# Patient Record
Sex: Female | Born: 1990 | Hispanic: Yes | Marital: Married | Smoking: Never smoker
Health system: Southern US, Community
[De-identification: ages and names within clinical notes are randomized; demographics above are authoritative.]

## PROBLEM LIST (undated history)

## (undated) ENCOUNTER — Emergency Department: Admission: EM | Discharge status: Absent without leave | Payer: Self-pay

## (undated) ENCOUNTER — Inpatient Hospital Stay: Payer: Self-pay

## (undated) DIAGNOSIS — O480 Post-term pregnancy: Secondary | ICD-10-CM

## (undated) DIAGNOSIS — N390 Urinary tract infection, site not specified: Secondary | ICD-10-CM

## (undated) DIAGNOSIS — Z8669 Personal history of other diseases of the nervous system and sense organs: Secondary | ICD-10-CM

## (undated) HISTORY — PX: NO PAST SURGERIES: SHX2092

## (undated) HISTORY — PX: OTHER SURGICAL HISTORY: SHX169

## (undated) HISTORY — DX: Urinary tract infection, site not specified: N39.0

## (undated) HISTORY — DX: Personal history of other diseases of the nervous system and sense organs: Z86.69

---

## 2014-03-13 LAB — SICKLE CELL SCREEN: Sickle Cell Screen: NORMAL

## 2014-03-14 LAB — OB RESULTS CONSOLE RPR: RPR: NONREACTIVE

## 2014-03-14 LAB — OB RESULTS CONSOLE VARICELLA ZOSTER ANTIBODY, IGG: Varicella: IMMUNE

## 2014-03-14 LAB — OB RESULTS CONSOLE ANTIBODY SCREEN: Antibody Screen: NEGATIVE

## 2014-03-14 LAB — OB RESULTS CONSOLE HGB/HCT, BLOOD: HEMOGLOBIN: 12.7 g/dL

## 2014-03-14 LAB — OB RESULTS CONSOLE RUBELLA ANTIBODY, IGM: Rubella: IMMUNE

## 2014-03-14 LAB — OB RESULTS CONSOLE HEPATITIS B SURFACE ANTIGEN: HEP B S AG: NEGATIVE

## 2014-03-14 LAB — OB RESULTS CONSOLE ABO/RH: RH TYPE: POSITIVE

## 2014-03-14 LAB — OB RESULTS CONSOLE HIV ANTIBODY (ROUTINE TESTING): HIV: NONREACTIVE

## 2014-03-24 ENCOUNTER — Ambulatory Visit: Payer: Self-pay | Admitting: Family Medicine

## 2014-04-07 NOTE — L&D Delivery Note (Signed)
Delivery Note At 10:08 AM a viable Female was delivered via Vaginal, Spontaneous Delivery (Presentation: Left Occiput Anterior).  APGAR: , ; weight  .   Placenta status:Expressed,[place] with velamentous insertion.  Cord: 3 vessels with the following complications:none .  Cord pH: NA  Anesthesia: None  Episiotomy: None Lacerations: None Suture Repair:NA Est. Blood Loss (mL): 350  Mom to postpartum.  Baby to Couplet care / Skin to Skin.  [name] 10/26/2014, 10:23 AM

## 2014-05-26 ENCOUNTER — Ambulatory Visit: Payer: Self-pay | Admitting: Advanced Practice Midwife

## 2014-09-20 LAB — OB RESULTS CONSOLE GBS: STREP GROUP B AG: NEGATIVE

## 2014-09-27 ENCOUNTER — Other Ambulatory Visit: Payer: Self-pay | Admitting: Advanced Practice Midwife

## 2014-09-27 DIAGNOSIS — IMO0002 Reserved for concepts with insufficient information to code with codable children: Secondary | ICD-10-CM

## 2014-10-02 ENCOUNTER — Ambulatory Visit
Admission: RE | Admit: 2014-10-02 | Discharge: 2014-10-02 | Disposition: A | Discharge status: Home / Self Care | Payer: Self-pay | Source: Ambulatory Visit | Attending: Advanced Practice Midwife | Admitting: Advanced Practice Midwife

## 2014-10-02 DIAGNOSIS — IMO0002 Reserved for concepts with insufficient information to code with codable children: Secondary | ICD-10-CM

## 2014-10-02 DIAGNOSIS — Z36 Encounter for antenatal screening of mother: Secondary | ICD-10-CM | POA: Insufficient documentation

## 2014-10-02 DIAGNOSIS — Z3A37 37 weeks gestation of pregnancy: Secondary | ICD-10-CM | POA: Insufficient documentation

## 2014-10-25 ENCOUNTER — Observation Stay
Admission: RE | Admit: 2014-10-25 | Discharge: 2014-10-25 | Disposition: A | Discharge status: Home / Self Care | Payer: Medicaid Other | Attending: Obstetrics and Gynecology | Admitting: Obstetrics and Gynecology

## 2014-10-25 DIAGNOSIS — O480 Post-term pregnancy: Secondary | ICD-10-CM

## 2014-10-25 HISTORY — DX: Post-term pregnancy: O48.0

## 2014-10-25 NOTE — Plan of Care (Signed)
Discharge instructions given and explained through interpreter.  Verbalized understanding. Signed copy on chart and one in hand.

## 2014-10-26 ENCOUNTER — Encounter: Payer: Self-pay | Admitting: *Deleted

## 2014-10-26 ENCOUNTER — Ambulatory Visit (INDEPENDENT_AMBULATORY_CARE_PROVIDER_SITE_OTHER): Payer: Self-pay | Admitting: Obstetrics and Gynecology

## 2014-10-26 ENCOUNTER — Encounter: Payer: Self-pay | Admitting: Obstetrics and Gynecology

## 2014-10-26 ENCOUNTER — Inpatient Hospital Stay
Admission: EM | Admit: 2014-10-26 | Discharge: 2014-10-27 | DRG: 775 | Disposition: A | Discharge status: Home / Self Care | Payer: Medicaid Other | Attending: Obstetrics and Gynecology | Admitting: Obstetrics and Gynecology

## 2014-10-26 ENCOUNTER — Other Ambulatory Visit: Payer: Self-pay | Admitting: Obstetrics and Gynecology

## 2014-10-26 VITALS — BP 122/76 | HR 96 | Ht 64.0 in | Wt 159.1 lb

## 2014-10-26 DIAGNOSIS — Z3A40 40 weeks gestation of pregnancy: Secondary | ICD-10-CM | POA: Diagnosis present

## 2014-10-26 DIAGNOSIS — Z3493 Encounter for supervision of normal pregnancy, unspecified, third trimester: Secondary | ICD-10-CM

## 2014-10-26 DIAGNOSIS — Z3490 Encounter for supervision of normal pregnancy, unspecified, unspecified trimester: Secondary | ICD-10-CM

## 2014-10-26 DIAGNOSIS — O480 Post-term pregnancy: Secondary | ICD-10-CM | POA: Diagnosis present

## 2014-10-26 DIAGNOSIS — O479 False labor, unspecified: Secondary | ICD-10-CM

## 2014-10-26 HISTORY — DX: Post-term pregnancy: O48.0

## 2014-10-26 LAB — CBC
HCT: 36.8 % (ref 35.0–47.0)
Hemoglobin: 12.1 g/dL (ref 12.0–16.0)
MCH: 25.9 pg — AB (ref 26.0–34.0)
MCHC: 32.9 g/dL (ref 32.0–36.0)
MCV: 78.7 fL — ABNORMAL LOW (ref 80.0–100.0)
Platelets: 230 10*3/uL (ref 150–440)
RBC: 4.67 MIL/uL (ref 3.80–5.20)
RDW: 15.5 % — AB (ref 11.5–14.5)
WBC: 23.1 10*3/uL — AB (ref 3.6–11.0)

## 2014-10-26 LAB — TYPE AND SCREEN
ABO/RH(D): O POS
ANTIBODY SCREEN: NEGATIVE

## 2014-10-26 LAB — ABO/RH: ABO/RH(D): O POS

## 2014-10-26 MED ORDER — FERROUS SULFATE 325 (65 FE) MG PO TABS
325.0000 mg | ORAL_TABLET | Freq: Two times a day (BID) | ORAL | Status: DC
  Administered 2014-10-26: 325 mg via ORAL
  Filled 2014-10-26: qty 1

## 2014-10-26 MED ORDER — SODIUM CHLORIDE 0.9 % IV SOLN: 1.0000 g | INTRAVENOUS | Status: DC

## 2014-10-26 MED ORDER — ONDANSETRON HCL 4 MG/2ML IJ SOLN: 4.0000 mg | Freq: Four times a day (QID) | INTRAMUSCULAR | Status: DC | PRN

## 2014-10-26 MED ORDER — IBUPROFEN 800 MG PO TABS
800.0000 mg | ORAL_TABLET | Freq: Three times a day (TID) | ORAL | Status: DC
  Administered 2014-10-26 (×2): 800 mg via ORAL
  Filled 2014-10-26 (×2): qty 1

## 2014-10-26 MED ORDER — OXYTOCIN BOLUS FROM INFUSION: 500.0000 mL | INTRAVENOUS | Status: DC

## 2014-10-26 MED ORDER — PRENATAL MULTIVITAMIN CH
1.0000 | ORAL_TABLET | Freq: Every day | ORAL | Status: DC
  Administered 2014-10-26: 1 via ORAL
  Filled 2014-10-26: qty 1

## 2014-10-26 MED ORDER — DIPHENHYDRAMINE HCL 25 MG PO CAPS: 25.0000 mg | ORAL_CAPSULE | Freq: Four times a day (QID) | ORAL | Status: DC | PRN

## 2014-10-26 MED ORDER — CITRIC ACID-SODIUM CITRATE 334-500 MG/5ML PO SOLN: 30.0000 mL | ORAL | Status: DC | PRN

## 2014-10-26 MED ORDER — OXYCODONE-ACETAMINOPHEN 5-325 MG PO TABS
ORAL_TABLET | ORAL | Status: AC
  Administered 2014-10-26: 1 via ORAL
  Filled 2014-10-26: qty 2

## 2014-10-26 MED ORDER — LACTATED RINGERS IV SOLN: INTRAVENOUS | Status: DC

## 2014-10-26 MED ORDER — OXYCODONE-ACETAMINOPHEN 5-325 MG PO TABS: 1.0000 | ORAL_TABLET | ORAL | Status: DC | PRN

## 2014-10-26 MED ORDER — MEASLES, MUMPS & RUBELLA VAC ~~LOC~~ INJ: 0.5000 mL | INJECTION | Freq: Once | SUBCUTANEOUS | Status: DC

## 2014-10-26 MED ORDER — DOCUSATE SODIUM 100 MG PO CAPS
100.0000 mg | ORAL_CAPSULE | Freq: Two times a day (BID) | ORAL | Status: DC
  Administered 2014-10-26: 100 mg via ORAL
  Filled 2014-10-26: qty 1

## 2014-10-26 MED ORDER — TETANUS-DIPHTH-ACELL PERTUSSIS 5-2.5-18.5 LF-MCG/0.5 IM SUSP: 0.5000 mL | INTRAMUSCULAR | Status: DC | PRN

## 2014-10-26 MED ORDER — LANOLIN HYDROUS EX OINT: TOPICAL_OINTMENT | CUTANEOUS | Status: DC | PRN

## 2014-10-26 MED ORDER — OXYTOCIN 10 UNIT/ML IJ SOLN
INTRAMUSCULAR | Status: AC
  Administered 2014-10-26: 10:00:00
  Filled 2014-10-26: qty 1

## 2014-10-26 MED ORDER — OXYTOCIN 40 UNITS IN LACTATED RINGERS INFUSION - SIMPLE MED: 62.5000 mL/h | INTRAVENOUS | Status: DC

## 2014-10-26 MED ORDER — OXYCODONE-ACETAMINOPHEN 5-325 MG PO TABS
2.0000 | ORAL_TABLET | ORAL | Status: DC | PRN
Start: 2014-10-26 — End: 2014-10-27
  Administered 2014-10-26 (×2): 1 via ORAL
  Filled 2014-10-26: qty 1
  Filled 2014-10-26: qty 2

## 2014-10-26 MED ORDER — ACETAMINOPHEN 325 MG PO TABS: 650.0000 mg | ORAL_TABLET | ORAL | Status: DC | PRN

## 2014-10-26 MED ORDER — BENZOCAINE-MENTHOL 20-0.5 % EX AERO: 1.0000 "application " | INHALATION_SPRAY | CUTANEOUS | Status: DC | PRN

## 2014-10-26 MED ORDER — LIDOCAINE HCL (PF) 1 % IJ SOLN
30.0000 mL | INTRAMUSCULAR | Status: DC | PRN
  Filled 2014-10-26: qty 30

## 2014-10-26 MED ORDER — FENTANYL CITRATE (PF) 100 MCG/2ML IJ SOLN: 50.0000 ug | INTRAMUSCULAR | Status: DC | PRN

## 2014-10-26 MED ORDER — WITCH HAZEL-GLYCERIN EX PADS: 1.0000 "application " | MEDICATED_PAD | CUTANEOUS | Status: DC | PRN

## 2014-10-26 MED ORDER — OXYCODONE-ACETAMINOPHEN 5-325 MG PO TABS: 2.0000 | ORAL_TABLET | ORAL | Status: DC | PRN

## 2014-10-26 MED ORDER — LACTATED RINGERS IV SOLN: 500.0000 mL | INTRAVENOUS | Status: DC | PRN

## 2014-10-26 MED ORDER — DIBUCAINE 1 % RE OINT: 1.0000 "application " | TOPICAL_OINTMENT | RECTAL | Status: DC | PRN

## 2014-10-26 NOTE — H&P (Signed)
Tamara Hahn is a 24 y.o. female G3P2002 at 64.6 weeks presenting for delivery  History OB History    Gravida Para Term Preterm AB TAB SAB Ectopic Multiple Living   0 0     Past Medical History  Diagnosis Date  . Post-dates pregnancy 10/25/2014   No past surgical history on file. Family History: family history is not on file. Social History:  reports that she has never smoked. She does not have any smokeless tobacco history on file. She reports that she does not drink alcohol or use illicit drugs.   Prenatal Transfer Tool  Maternal Diabetes: No Genetic Screening: Normal Maternal Ultrasounds/Referrals: Normal Fetal Ultrasounds or other Referrals:   Maternal Substance Abuse:  No Significant Maternal Medications:  None Significant Maternal Lab Results:  None Other Comments:  PNC at [facility] and [facility]. Anatomy scan reportedly normal per pt. No records for review.  ROS Painful contractions. No PIH symptoms.   unknown if currently breastfeeding. Exam Physical Exam  Prenatal labs: ABO, Rh:   Antibody:   Rubella:   RPR:    HBsAg:    HIV:    GBS:     Assessment/Plan: 40.6 week IUP in active labor   Anticipate SVD  Daphine Deutscher A Treena Cosman 10/26/2014, 10:28 AM

## 2014-10-26 NOTE — Progress Notes (Signed)
Chief complaint: 1.  Delivery management planning. 2.  40-6/[redacted] weeks gestation. 3.  Contractions.  Patient is a 24 year old gravida 3, para 2002, female at 13.[redacted] weeks gestation who presents for delivery management planning.  She has been having contractions since 7 AM, which are extremely painful.  No rupture membranes.  No vaginal bleeding.  OBJECTIVE: There were no vitals taken for this visit. Cervix: 6 cm/90% effaced/0 station/vertex. IMPRESSION: 1.  Term IUP in active labor. PLAN: 1.  To labor and delivery for delivery.

## 2014-10-27 LAB — CBC
HCT: 34.5 % — ABNORMAL LOW (ref 35.0–47.0)
HEMOGLOBIN: 11.2 g/dL — AB (ref 12.0–16.0)
MCH: 25.6 pg — ABNORMAL LOW (ref 26.0–34.0)
MCHC: 32.5 g/dL (ref 32.0–36.0)
MCV: 78.9 fL — ABNORMAL LOW (ref 80.0–100.0)
Platelets: 222 10*3/uL (ref 150–440)
RBC: 4.37 MIL/uL (ref 3.80–5.20)
RDW: 15.6 % — ABNORMAL HIGH (ref 11.5–14.5)
WBC: 17.2 10*3/uL — ABNORMAL HIGH (ref 3.6–11.0)

## 2014-10-27 LAB — RPR: RPR Ser Ql: NONREACTIVE

## 2014-10-27 MED ORDER — OXYCODONE-ACETAMINOPHEN 5-325 MG PO TABS: 2.0000 | ORAL_TABLET | ORAL | Status: DC | PRN

## 2014-10-27 MED ORDER — IBUPROFEN 800 MG PO TABS: 800.0000 mg | ORAL_TABLET | Freq: Three times a day (TID) | ORAL | Status: DC

## 2014-10-27 MED ORDER — PRENATAL MULTIVITAMIN CH: 1.0000 | ORAL_TABLET | Freq: Every day | ORAL | Status: AC

## 2014-10-27 MED ORDER — MEDROXYPROGESTERONE ACETATE 150 MG/ML IM SUSP: 150.0000 mg | Freq: Once | INTRAMUSCULAR | Status: DC

## 2014-10-27 MED ORDER — MEDROXYPROGESTERONE ACETATE 150 MG/ML IM SUSP
150.0000 mg | Freq: Once | INTRAMUSCULAR | Status: AC
  Administered 2014-10-27: 150 mg via INTRAMUSCULAR
  Filled 2014-10-27: qty 1

## 2014-10-27 NOTE — Discharge Instructions (Signed)

## 2014-10-27 NOTE — Progress Notes (Signed)
Post Partum Day 1 Subjective: no complaints, up ad lib, voiding and tolerating PO  Objective: Blood pressure 116/71, pulse 67, temperature 98 F (36.7 C), temperature source Oral, resp. rate 20, weight 154 lb (69.854 kg), SpO2 100 %, unknown if currently breastfeeding.  Physical Exam:  General: alert and cooperative Lochia: appropriate Uterine Fundus: firm Incision: none DVT Evaluation: No evidence of DVT seen on physical exam.   Recent Labs  10/26/14 1228 10/27/14 0535  HGB 12.1 11.2*  HCT 36.8 34.5*    Assessment/Plan: Discharge home, Breastfeeding and Contraception Condoms   LOS: 1 day   Tamara Hahn 10/27/2014, 7:50 AM

## 2014-10-27 NOTE — Progress Notes (Signed)
Pt discharged at this time via wheelchair escorted by nurse tech; pt going home with significant other and new baby

## 2014-10-27 NOTE — Discharge Summary (Signed)
Obstetric Discharge Summary Reason for Admission: onset of labor Prenatal Procedures: NST and ultrasound Intrapartum Procedures: spontaneous vaginal delivery Postpartum Procedures: none Complications-Operative and Postpartum: none HEMOGLOBIN  Date Value Ref Range Status  10/27/2014 11.2* 12.0 - 16.0 g/dL Final  40/98/1191 47.8 g/dL Final   HCT  Date Value Ref Range Status  10/27/2014 34.5* 35.0 - 47.0 % Final    Physical Exam:  General: alert and cooperative Lochia: appropriate Uterine Fundus: firm Incision: none DVT Evaluation: No evidence of DVT seen on physical exam.  Discharge Diagnoses: Term Pregnancy-delivered and Breastfeeding  Discharge Information: Date: 10/27/2014 Activity: unrestricted Diet: routine Medications: PNV, Ibuprofen, Percocet and Depo Provera Condition: stable Instructions: refer to practice specific booklet and Depo Provera given before discharge Discharge to: home Follow-up Information    Follow up with [facility] In 6 weeks.   Why:  6 week Post Partum Check   Contact information:   [address] [place] [address] [place] [address] [phone]       Newborn Data: Live born female  Birth Weight: 7 lb 3.5 oz (3275 g) APGAR: 8, 9  Home with mother.  Daphine Deutscher A Taiten Brawn 10/27/2014, 8:22 AM

## 2018-04-07 NOTE — L&D Delivery Note (Addendum)
Delivery Note  In room to see patient, reports pelvic pressure and the urge to push. SVE: 10/100/+3, vertex at 1232. Effective maternal pushing efforts noted.   Spontaneous vaginal birth of liveborn female infant in left occiput anterior position at 1241 over intact perineum. Infant immediate to maternal abdomen. Delayed cord clamping, skin to skin, three (3) vessel cord and cord blood collected. APGARs: 7, 9. Weight pending. Receiving nurse present at bedside for birth.   IM pitocin given in left thigh, see chart. Spontaneous delivery of intact placenta 1246, routine disposal. Uterus firm. Lochia small. QBL: 435 ml. Anesthesia none. Vault check completed. Counts correct x 2.   Initiate routine postpartum care and orders. Mom to postpartum.  Baby to Couplet care / Skin to Skin.  FOB present at bedside and overjoyed with birth.    Diona Fanti, CNM [facility] 12/25/2018, 1:28 PM

## 2018-04-28 LAB — OB RESULTS CONSOLE HIV ANTIBODY (ROUTINE TESTING): HIV: NONREACTIVE

## 2018-06-07 ENCOUNTER — Other Ambulatory Visit: Payer: Self-pay | Admitting: Advanced Practice Midwife

## 2018-06-07 DIAGNOSIS — Z369 Encounter for antenatal screening, unspecified: Secondary | ICD-10-CM

## 2018-06-07 LAB — OB RESULTS CONSOLE HEPATITIS B SURFACE ANTIGEN: Hepatitis B Surface Ag: NEGATIVE

## 2018-06-07 LAB — OB RESULTS CONSOLE HGB/HCT, BLOOD
HCT: 36 (ref 29–41)
Hemoglobin: 12.5

## 2018-06-07 LAB — OB RESULTS CONSOLE PLATELET COUNT: Platelets: 317000

## 2018-06-07 LAB — OB RESULTS CONSOLE RPR: RPR: NONREACTIVE

## 2018-06-07 LAB — OB RESULTS CONSOLE ABO/RH
ABO/RH(D): O POS
RH Type: POSITIVE

## 2018-06-07 LAB — OB RESULTS CONSOLE GC/CHLAMYDIA
Chlamydia: NEGATIVE
Gonorrhea: NEGATIVE

## 2018-06-07 LAB — OB RESULTS CONSOLE VARICELLA ZOSTER ANTIBODY, IGG: Varicella: IMMUNE

## 2018-06-07 LAB — OB RESULTS CONSOLE ANTIBODY SCREEN: Antibody Screen: NEGATIVE

## 2018-06-07 LAB — OB RESULTS CONSOLE RUBELLA ANTIBODY, IGM: Rubella: IMMUNE

## 2018-06-21 ENCOUNTER — Other Ambulatory Visit: Payer: Self-pay

## 2018-06-21 DIAGNOSIS — O0991 Supervision of high risk pregnancy, unspecified, first trimester: Secondary | ICD-10-CM

## 2018-06-24 ENCOUNTER — Ambulatory Visit (HOSPITAL_BASED_OUTPATIENT_CLINIC_OR_DEPARTMENT_OTHER)
Admission: RE | Admit: 2018-06-24 | Discharge: 2018-06-24 | Disposition: A | Discharge status: Home / Self Care | Payer: Self-pay | Source: Ambulatory Visit | Attending: Maternal & Fetal Medicine | Admitting: Maternal & Fetal Medicine

## 2018-06-24 ENCOUNTER — Ambulatory Visit
Admission: RE | Admit: 2018-06-24 | Discharge: 2018-06-24 | Disposition: A | Discharge status: Home / Self Care | Payer: Self-pay | Source: Ambulatory Visit | Attending: Maternal & Fetal Medicine | Admitting: Maternal & Fetal Medicine

## 2018-06-24 ENCOUNTER — Other Ambulatory Visit: Payer: Self-pay

## 2018-06-24 VITALS — BP 105/60 | HR 88 | Temp 98.4°F | Resp 18 | Wt 147.0 lb

## 2018-06-24 DIAGNOSIS — Z369 Encounter for antenatal screening, unspecified: Secondary | ICD-10-CM

## 2018-06-24 DIAGNOSIS — Z3A12 12 weeks gestation of pregnancy: Secondary | ICD-10-CM | POA: Insufficient documentation

## 2018-06-24 DIAGNOSIS — Z3682 Encounter for antenatal screening for nuchal translucency: Secondary | ICD-10-CM | POA: Insufficient documentation

## 2018-06-24 DIAGNOSIS — Z8279 Family history of other congenital malformations, deformations and chromosomal abnormalities: Secondary | ICD-10-CM

## 2018-06-24 NOTE — Progress Notes (Signed)
Referring physician:  [facility] Department Length of Consultation: 40 minutes   [name]  was referred to [facility] [place] for genetic counseling to review prenatal screening and testing options.  This note summarizes the information we discussed.    We offered the following routine screening tests for this pregnancy:  First trimester screening, which includes nuchal translucency ultrasound screen and first trimester maternal serum marker screening.  The nuchal translucency has approximately an 80% detection rate for Down syndrome and can be positive for other chromosome abnormalities as well as congenital heart defects.  When combined with a maternal serum marker screening, the detection rate is up to 90% for Down syndrome and up to 97% for trisomy 18.     Maternal serum marker screening, a blood test that measures pregnancy proteins, can provide risk assessments for Down syndrome, trisomy 18, and open neural tube defects (spina bifida, anencephaly). Because it does not directly examine the fetus, it cannot positively diagnose or rule out these problems.  Targeted ultrasound uses high frequency sound waves to create an image of the developing fetus.  An ultrasound is often recommended as a routine means of evaluating the pregnancy.  It is also used to screen for fetal anatomy problems (for example, a heart defect) that might be suggestive of a chromosomal or other abnormality.   Should these screening tests indicate an increased concern, then the following additional testing options would be offered:  The chorionic villus sampling procedure is available for first trimester chromosome analysis.  This involves the withdrawal of a small amount of chorionic villi (tissue from the developing placenta).  Risk of pregnancy loss is estimated to be approximately 1 in 200 to 1 in 100 (0.5 to 1%).  There is approximately a 1% (1 in 100) chance that the CVS  chromosome results will be unclear.  Chorionic villi cannot be tested for neural tube defects.     Amniocentesis involves the removal of a small amount of amniotic fluid from the sac surrounding the fetus with the use of a thin needle inserted through the maternal abdomen and uterus.  Ultrasound guidance is used throughout the procedure.  Fetal cells from amniotic fluid are directly evaluated and > 99.5% of chromosome problems and > 98% of open neural tube defects can be detected. This procedure is generally performed after the 15th week of pregnancy.  The main risks to this procedure include complications leading to miscarriage in less than 1 in 200 cases (0.5%).  As another option for information if the pregnancy is suspected to be an an increased chance for certain chromosome conditions, we also reviewed the availability of cell free fetal DNA testing from maternal blood to determine whether or not the baby may have either Down syndrome, trisomy 26, or trisomy 65.  This test utilizes a maternal blood sample and DNA sequencing technology to isolate circulating cell free fetal DNA from maternal plasma.  The fetal DNA can then be analyzed for DNA sequences that are derived from the three most common chromosomes involved in aneuploidy, chromosomes 13, 18, and 21.  If the overall amount of DNA is greater than the expected level for any of these chromosomes, aneuploidy is suspected.  While we do not consider it a replacement for invasive testing and karyotype analysis, a negative result from this testing would be reassuring, though not a guarantee of a normal chromosome complement for the baby.  An abnormal result is certainly suggestive of an abnormal  chromosome complement, though we would still recommend CVS or amniocentesis to confirm any findings from this testing.  Cystic Fibrosis and Spinal Muscular Atrophy (SMA) screening were also discussed with the patient. Both conditions are recessive, which means that  both parents must be carriers in order to have a child with the disease.  Cystic fibrosis (CF) is one of the most common genetic conditions in persons of Caucasian ancestry.  This condition occurs in approximately 1 in 2,500 Caucasian persons and results in thickened secretions in the lungs, digestive, and reproductive systems.  For a baby to be at risk for having CF, both of the parents must be carriers for this condition.  Approximately 1 in 67 Caucasian persons is a carrier for CF.  Current carrier testing looks for the most common mutations in the gene for CF and can detect approximately 90% of carriers in the Caucasian population.  This means that the carrier screening can greatly reduce, but cannot eliminate, the chance for an individual to have a child with CF.  If an individual is found to be a carrier for CF, then carrier testing would be available for the partner. As part of [place]'s newborn screening profile, all babies born in the state of [place] will have a two-tier screening process.  Specimens are first tested to determine the concentration of immunoreactive trypsinogen (IRT).  The top 5% of specimens with the highest IRT values then undergo DNA testing using a panel of over 40 common CF mutations. SMA is a neurodegenerative disorder that leads to atrophy of skeletal muscle and overall weakness.  This condition is also more prevalent in the Caucasian population, with 1 in 40-1 in 60 persons being a carrier and 1 in 6,000-1 in 10,000 children being affected.  There are multiple forms of the disease, with some causing death in infancy to other forms with survival into adulthood.  The genetics of SMA is complex, but carrier screening can detect up to 95% of carriers in the Caucasian population.  Similar to CF, a negative result can greatly reduce, but cannot eliminate, the chance to have a child with SMA.  We obtained a detailed family history and pregnancy history. The patient reported  that she had two paternal half siblings born with abnormalities of the brain.  One passed away at birth and the other passed away at 12 days of life.  She has no details about the type of birth defect or possible causes.  We discussed the use of second trimester ultrasound to assess the brain anatomy as well as third trimester ultrasound to assess for ventriculomegaly. In addition, her partner reported a maternal first cousin with Down syndrome.  Down syndrome is caused by an extra copy of the genetic instructions located on chromosome number 21.  These extra instructions result in the characteristic facial appearance, intellectual disabilities and an increased risk for some types of birth defects.  The majority (95%) of persons with Down syndrome have three freestanding copies of chromosome number 21, called trisomy 72.  This type of Down syndrome occurs as a sporadic condition and does not increase the chance for other family members to have Down syndrome.  Rarely, Down syndrome is caused by a rearrangement of the genetic instructions, where the extra chromosome number 21 is attached to another chromosome.  This type of chromosome rearrangement can be passed down through families and therefore may increase the chance for Down syndrome in other family members.  We cannot determine which type of  Down syndrome is present without documentation of chromosome studies in the affected family member.  If any additional information is obtained about this history, please do not hesitate to contact us. The remainder of the family history was reported to be unremarkable for birth defects, intellectual delays, recurrent pregnancy loss or known chromosome abnormalities.  [name] stated that this is her fourth pregnancy, the second with her current partner. All three of her children are in good health.  She reported no complications or exposures in this pregnancy that would be expected to increase the risk for birth  defects.  After consideration of the options, [name] elected to proceed with an ultrasound and to decline carrier testing as well as first trimester screening.  An ultrasound was performed at the time of the visit.  Fetal anatomy could not be assessed due to early gestational age.  Please refer to the ultrasound report for details of that study.  [name] was encouraged to call with questions or concerns.  We can be contacted at [phone].  Labs ordered: none  Cherly Anderson, MS, CGC

## 2018-07-01 NOTE — Progress Notes (Signed)
Pt seen by me, agree with assessment and plan as outlined by [facility].

## 2018-08-04 LAB — OB RESULTS CONSOLE TSH: TSH: 0.995

## 2018-08-26 ENCOUNTER — Other Ambulatory Visit: Payer: Self-pay

## 2018-08-26 DIAGNOSIS — O0992 Supervision of high risk pregnancy, unspecified, second trimester: Secondary | ICD-10-CM

## 2018-08-26 DIAGNOSIS — O0991 Supervision of high risk pregnancy, unspecified, first trimester: Secondary | ICD-10-CM

## 2018-09-02 ENCOUNTER — Ambulatory Visit
Admit: 2018-09-02 | Discharge: 2018-09-02 | Disposition: A | Discharge status: Home / Self Care | Payer: Self-pay | Source: Ambulatory Visit | Attending: Maternal & Fetal Medicine | Admitting: Maternal & Fetal Medicine

## 2018-09-02 ENCOUNTER — Other Ambulatory Visit: Payer: Self-pay

## 2018-09-02 DIAGNOSIS — Z3A22 22 weeks gestation of pregnancy: Secondary | ICD-10-CM | POA: Insufficient documentation

## 2018-09-02 DIAGNOSIS — O0992 Supervision of high risk pregnancy, unspecified, second trimester: Secondary | ICD-10-CM | POA: Insufficient documentation

## 2018-10-12 DIAGNOSIS — Z3480 Encounter for supervision of other normal pregnancy, unspecified trimester: Secondary | ICD-10-CM

## 2018-10-12 NOTE — Progress Notes (Signed)
Chart abstraction complete 10/12/2018.

## 2018-10-13 ENCOUNTER — Other Ambulatory Visit: Payer: Self-pay

## 2018-10-13 ENCOUNTER — Other Ambulatory Visit: Payer: Self-pay | Admitting: Family Medicine

## 2018-10-13 ENCOUNTER — Ambulatory Visit: Payer: Self-pay | Admitting: Nurse Practitioner

## 2018-10-13 VITALS — BP 104/61 | Temp 97.8°F | Wt 157.2 lb

## 2018-10-13 DIAGNOSIS — Z3483 Encounter for supervision of other normal pregnancy, third trimester: Secondary | ICD-10-CM

## 2018-10-13 DIAGNOSIS — Z23 Encounter for immunization: Secondary | ICD-10-CM

## 2018-10-13 LAB — HEMOGLOBIN, FINGERSTICK: Hemoglobin: 11.9 g/dL (ref 11.1–15.9)

## 2018-10-13 LAB — OB RESULTS CONSOLE HIV ANTIBODY (ROUTINE TESTING): HIV: NONREACTIVE

## 2018-10-13 NOTE — Progress Notes (Signed)
   PRENATAL VISIT NOTE  Subjective:  Tamara Hahn is a 28 y.o. [phone] at [redacted]w[redacted]d being seen today for ongoing prenatal care.  She is currently monitored for the following issues for this low-risk pregnancy and has Post-dates pregnancy; SVD (spontaneous vaginal delivery); Pregnancy; Family history of congenital anomalies; and First trimester screening on their problem list.  Patient reports no complaints.   .  .   . denies vomiting, abdominal pain, fussiness, diarrhea, cough and difficulty breathing leaking of fluid/ROM.   The following portions of the patient's history were reviewed and updated as appropriate: allergies, current medications, past family history, past medical history, past social history, past surgical history and problem list. Problem list updated.  Objective:   Vitals:   10/13/18 1313  BP: 104/61  Temp: 97.8 F (36.6 C)  Weight: 157 lb 3.2 oz (71.3 kg)    Fetal Status:           General:  Alert, oriented and cooperative. Patient is in no acute distress.  Skin: Skin is warm and dry. No rash noted.   Cardiovascular: Normal heart rate noted  Respiratory: Normal respiratory effort, no problems with respiration noted  Abdomen: Soft, gravid, appropriate for gestational age.        Pelvic: Cervical exam deferred        Extremities: Normal range of motion.     Mental Status: Normal mood and affect. Normal behavior. Normal judgment and thought content.   Assessment and Plan:  Pregnancy: G4P3003 at [redacted]w[redacted]d  1. Prenatal care, subsequent pregnancy, third trimester Client here for 28 wk labs Await results Blood transfusion information discussed and consent signed  Denies any additional questions or concerns at this time Client verbalizes understanding and is in agreement with plan of care    Preterm labor symptoms and general obstetric precautions including but not limited to vaginal bleeding, contractions, leaking of fluid and fetal movement were reviewed in  detail with the patient. Please refer to After Visit Summary for other counseling recommendations.  No follow-ups on file.  No future appointments.  Berniece Andreas, NP

## 2018-10-13 NOTE — Progress Notes (Signed)
In for visit; 28 wk. Labs today; discussed [place] Blood Transfusion Info.; Tdap today Debera Lat

## 2018-10-14 LAB — RPR: RPR Ser Ql: NONREACTIVE

## 2018-10-14 LAB — GLUCOSE, 1 HOUR GESTATIONAL: Gestational Diabetes Screen: 84 mg/dL (ref 65–139)

## 2018-10-15 DIAGNOSIS — Z3480 Encounter for supervision of other normal pregnancy, unspecified trimester: Secondary | ICD-10-CM | POA: Insufficient documentation

## 2018-10-17 DIAGNOSIS — O99281 Endocrine, nutritional and metabolic diseases complicating pregnancy, first trimester: Secondary | ICD-10-CM | POA: Insufficient documentation

## 2018-10-17 DIAGNOSIS — E079 Disorder of thyroid, unspecified: Secondary | ICD-10-CM

## 2018-10-17 DIAGNOSIS — Z6740 Type O blood, Rh positive: Secondary | ICD-10-CM | POA: Insufficient documentation

## 2018-10-17 DIAGNOSIS — Z87798 Personal history of other (corrected) congenital malformations: Secondary | ICD-10-CM | POA: Insufficient documentation

## 2018-10-27 ENCOUNTER — Other Ambulatory Visit: Payer: Self-pay

## 2018-10-27 ENCOUNTER — Ambulatory Visit: Payer: Self-pay | Admitting: Nurse Practitioner

## 2018-10-27 DIAGNOSIS — Z3480 Encounter for supervision of other normal pregnancy, unspecified trimester: Secondary | ICD-10-CM

## 2018-10-27 NOTE — Progress Notes (Signed)
   TELEPHONE OBSTETRICS VISIT ENCOUNTER NOTE  I connected with Tamara Hahn on 10/27/18 at 10:00 AM EDT by telephone at home and verified that I am speaking with the correct person using two identifiers.   I discussed the limitations, risks, security and privacy concerns of performing an evaluation and management service by telephone and the availability of in person appointments. I also discussed with the patient that there may be a patient responsible charge related to this service. The patient expressed understanding and agreed to proceed.  Subjective:  Tamara Hahn is a 28 y.o. G5P3003 at [redacted]w[redacted]d being followed for ongoing prenatal care.  She is currently monitored for the following issues for this high-risk pregnancy and has Family history of congenital anomalies; Encounter for supervision of normal pregnancy in multigravida; Thyroid dysfunction in pregnancy, antepartum, first trimester; Type O blood, Rh positive; and Low birth weight infant on their problem list.  Patient reports no complaints. Reports fetal movement. Denies any contractions, bleeding or leaking of fluid.   The following portions of the patient's history were reviewed and updated as appropriate: allergies, current medications, past family history, past medical history, past social history, past surgical history and problem list.   Objective:   General:  Alert, oriented and cooperative.   Mental Status: Normal mood and affect perceived. Normal judgment and thought content.  Rest of physical exam deferred due to type of encounter  Assessment and Plan:  Pregnancy: G5P3003 at [redacted]w[redacted]d There are no diagnoses linked to this encounter. Preterm labor symptoms and general obstetric precautions including but not limited to vaginal bleeding, contractions, leaking of fluid and fetal movement were reviewed in detail with the patient.  I discussed the assessment and treatment plan with the patient. The patient was provided  an opportunity to ask questions and all were answered. The patient agreed with the plan and demonstrated an understanding of the instructions. The patient was advised to call back or seek an in-person office evaluation/go to the hospital for any urgent or concerning symptoms.  Please refer to After Visit Summary for other counseling recommendations.   I provided 4 minutes of non-face-to-face time during this encounter.  Return in about 2 weeks (around 11/10/2018) for routine prenatal care.  Future Appointments  Date Time Provider [place]  11/10/2018  1:40 PM [facility] PROVIDER [facility] None    Tamara Andreas, NP

## 2018-10-27 NOTE — Progress Notes (Signed)
Telehealth phone call appt with patient for RN portion. RN identified patient and confirmed current location. Patient states she does not have scales and home BP cuff to assess weight and BP. Patient denies s/s or exposure to Covid-19. Patient taking PNV QD and denies ED/ hospital visits since last RV. Patient scheduled 2 week RV appt for 11/10/2018 @ 1:40. Instructed patient to remain at home phone number for provider to call and complete portion of appt.  Hal Morales, RN

## 2018-11-10 ENCOUNTER — Other Ambulatory Visit: Payer: Self-pay

## 2018-11-10 ENCOUNTER — Ambulatory Visit: Payer: Self-pay | Admitting: Nurse Practitioner

## 2018-11-10 DIAGNOSIS — Z3480 Encounter for supervision of other normal pregnancy, unspecified trimester: Secondary | ICD-10-CM

## 2018-11-10 NOTE — Progress Notes (Signed)
Negative responses to Covid-19 screening questions for client and 30 year old daughter with her at appt today. Denies international travel for self or FOB since pregnant. Taking PNV QD. Shona Needles, RN

## 2018-11-10 NOTE — Progress Notes (Signed)
PRENATAL VISIT NOTE  Subjective:  Tamara Hahn is a 28 y.o. G5P3003 at [redacted]w[redacted]d being seen today for ongoing prenatal care.  She is currently monitored for the following issues for this low-risk pregnancy and has Family history of congenital anomalies; Encounter for supervision of normal pregnancy in multigravida; Thyroid dysfunction in pregnancy, antepartum, first trimester; Type O blood, Rh positive; and Low birth weight infant on their problem list.  Patient reports no complaints.  Contractions: Not present. Vag. Bleeding: None.  Movement: Present. Denies leaking of fluid/ROM.   The following portions of the patient's history were reviewed and updated as appropriate: allergies, current medications, past family history, past medical history, past social history, past surgical history and problem list. Problem list updated.  Objective:   Vitals:   11/10/18 1335  BP: 98/68  Temp: 98.4 F (36.9 C)  Weight: 160 lb 12.8 oz (72.9 kg)    Fetal Status: Fetal Heart Rate (bpm): 138 Fundal Height: 31 cm Movement: Present     General:  Alert, oriented and cooperative. Patient is in no acute distress.  Skin: Skin is warm and dry. No rash noted.   Cardiovascular: Normal heart rate noted  Respiratory: Normal respiratory effort, no problems with respiration noted  Abdomen: Soft, gravid, appropriate for gestational age.  Pain/Pressure: Absent     Pelvic: Cervical exam deferred        Extremities: Normal range of motion.  Edema: None  Mental Status: Normal mood and affect. Normal behavior. Normal judgment and thought content.   Assessment and Plan:  Pregnancy: [id number] at [redacted]w[redacted]d  1. Encounter for supervision of normal pregnancy in multigravida Client doing well  Client asked the following questions for review: Any headaches - denies Swelling in feet or hands - denies ROM - denies Vaginal bleeding or discharge - denies Feeling the baby move - yes Any contractions - denies Taking PNV  daily - yes [place] separate of PNV - N/A Do you smoke or did you smoke prior to pregnancy - n/a Next visit will be: 2 wks - Telehealth   Preterm labor symptoms and general obstetric precautions including but not limited to vaginal bleeding, contractions, leaking of fluid and fetal movement were reviewed in detail with the patient. Please refer to After Visit Summary for other counseling recommendations.  Return in about 2 weeks (around 11/24/2018) for telehealth.  Future Appointments  Date Time Provider [place]  11/24/2018  1:20 PM [facility] PROVIDER [facility] None    Berniece Andreas, NP

## 2018-11-24 ENCOUNTER — Telehealth: Payer: Self-pay

## 2018-11-24 ENCOUNTER — Ambulatory Visit: Payer: Self-pay | Admitting: Nurse Practitioner

## 2018-11-24 ENCOUNTER — Other Ambulatory Visit: Payer: Self-pay

## 2018-11-24 DIAGNOSIS — Z3480 Encounter for supervision of other normal pregnancy, unspecified trimester: Secondary | ICD-10-CM

## 2018-11-24 NOTE — Telephone Encounter (Signed)
Attempted to call for [facility] Telehealth visit; no answer, left voicemail message via interpreter [place]. [place] Debera Lat, RN

## 2018-11-24 NOTE — Progress Notes (Signed)
Call to client for Telehealth visit; confirmed identity; agrees to visit; reports taking PNV and no complaints Debera Lat, RN

## 2018-11-24 NOTE — Progress Notes (Signed)
     TELEPHONE OBSTETRICS VISIT ENCOUNTER NOTE  I connected with Tamara Hahn on 11/24/18 at  1:20 PM EDT by telephone at home and verified that I am speaking with the correct person using two identifiers.   I discussed the limitations, risks, security and privacy concerns of performing an evaluation and management service by telephone and the availability of in person appointments. I also discussed with the patient that there may be a patient responsible charge related to this service. The patient expressed understanding and agreed to proceed.  Subjective:  Tamara Hahn is a 28 y.o. G5P3003 at [redacted]w[redacted]d being followed for ongoing prenatal care.  She is currently monitored for the following issues for this low-risk pregnancy and has Family history of congenital anomalies; Encounter for supervision of normal pregnancy in multigravida; Thyroid dysfunction in pregnancy, antepartum, first trimester; Type O blood, Rh positive; and Low birth weight infant on their problem list.  Patient reports no complaints. Reports fetal movement. Denies any contractions, bleeding or leaking of fluid.   The following portions of the patient's history were reviewed and updated as appropriate: allergies, current medications, past family history, past medical history, past social history, past surgical history and problem list.   Objective:   General:  Alert, oriented and cooperative.   Mental Status: Normal mood and affect perceived. Normal judgment and thought content.  Rest of physical exam deferred due to type of encounter  Assessment and Plan:  Pregnancy: G5P3003 at [redacted]w[redacted]d 1. Encounter for supervision of normal pregnancy in multigravida Client doing well Reviewed OB flow sheet questions and denies any need for any refills on PNV's at this time. Client denies the following: Headaches, smoking currently or prior to pregnancy and vaginal discharge.  Preterm labor symptoms and general obstetric  precautions including but not limited to vaginal bleeding, contractions, leaking of fluid and fetal movement were reviewed in detail with the patient.  I discussed the assessment and treatment plan with the patient. The patient was provided an opportunity to ask questions and all were answered. The patient agreed with the plan and demonstrated an understanding of the instructions. The patient was advised to call back or seek an in-person office evaluation/go to the hospital for any urgent or concerning symptoms.  Please refer to After Visit Summary for other counseling recommendations.   I provided 5 minutes of non-face-to-face time during this encounter.  No follow-ups on file.  Future Appointments  Date Time Provider [place]  12/08/2018  8:20 AM [facility] PROVIDER [facility] None    Berniece Andreas, NP

## 2018-12-08 ENCOUNTER — Ambulatory Visit: Payer: Self-pay | Admitting: Advanced Practice Midwife

## 2018-12-08 ENCOUNTER — Other Ambulatory Visit: Payer: Self-pay

## 2018-12-08 VITALS — BP 105/70 | Temp 97.6°F | Wt 162.0 lb

## 2018-12-08 DIAGNOSIS — Z3480 Encounter for supervision of other normal pregnancy, unspecified trimester: Secondary | ICD-10-CM

## 2018-12-08 NOTE — Progress Notes (Signed)
Pt to clinic for 36 week labs. Pt desires to self collect. Abuse and PHQ9 completed. Pt is taking PNV.

## 2018-12-08 NOTE — Progress Notes (Signed)
   PRENATAL VISIT NOTE  Subjective:  Tamara Hahn is a 28 y.o. G5P3003 at [redacted]w[redacted]d being seen today for ongoing prenatal care.  She is currently monitored for the following issues for this low-risk pregnancy and has Family history of congenital anomalies; Encounter for supervision of normal pregnancy in multigravida; Thyroid dysfunction in pregnancy, antepartum, first trimester; Type O blood, Rh positive; and Low birth weight infant on their problem list.  Patient reports no complaints.  Contractions: Not present. Vag. Bleeding: None.  Movement: Present. Denies leaking of fluid/ROM.   The following portions of the patient's history were reviewed and updated as appropriate: allergies, current medications, past family history, past medical history, past social history, past surgical history and problem list. Problem list updated.  Objective:   Vitals:   12/08/18 0817  BP: 105/70  Temp: 97.6 F (36.4 C)  Weight: 162 lb (73.5 kg)    Fetal Status: Fetal Heart Rate (bpm): 140 Fundal Height: 34 cm Movement: Present  Presentation: Vertex  General:  Alert, oriented and cooperative. Patient is in no acute distress.  Skin: Skin is warm and dry. No rash noted.   Cardiovascular: Normal heart rate noted  Respiratory: Normal respiratory effort, no problems with respiration noted  Abdomen: Soft, gravid, appropriate for gestational age.  Pain/Pressure: Absent     Pelvic: Cervical exam deferred        Extremities: Normal range of motion.  Edema: None  Mental Status: Normal mood and affect. Normal behavior. Normal judgment and thought content.   Assessment and Plan:  Pregnancy: G5P3003 at [redacted]w[redacted]d  1. Encounter for supervision of normal pregnancy in multigravida Feels well. Desires Nexplanon pp for birth control.  No car seat yet.  Knows when to go to L&D.  [facility] for Peds.  Self collected GBS/GC/Chlamydia - GBS Culture - Chlamydia/GC NAA, Confirmation   Preterm labor symptoms and general  obstetric precautions including but not limited to vaginal bleeding, contractions, leaking of fluid and fetal movement were reviewed in detail with the patient. Please refer to After Visit Summary for other counseling recommendations.  No follow-ups on file.  No future appointments.  Herbie Saxon, CNM

## 2018-12-12 LAB — CULTURE, BETA STREP (GROUP B ONLY): Strep Gp B Culture: NEGATIVE

## 2018-12-15 ENCOUNTER — Other Ambulatory Visit: Payer: Self-pay

## 2018-12-15 ENCOUNTER — Ambulatory Visit: Payer: Self-pay | Admitting: Advanced Practice Midwife

## 2018-12-15 VITALS — BP 109/72 | Temp 97.6°F | Wt 164.4 lb

## 2018-12-15 DIAGNOSIS — Z3480 Encounter for supervision of other normal pregnancy, unspecified trimester: Secondary | ICD-10-CM

## 2018-12-15 NOTE — Progress Notes (Signed)
Patient here for MH RV at 65 5/7 weeks,  with young daughter. Patient denies covid 19 exposure, symptoms, testing for both. Jenetta Downer, RN

## 2018-12-15 NOTE — Progress Notes (Signed)
   PRENATAL VISIT NOTE  Subjective:  Tamara Hahn is a 28 y.o. G5P3003 at [redacted]w[redacted]d being seen today for ongoing prenatal care.  She is currently monitored for the following issues for this low-risk pregnancy and has Family history of congenital anomalies; Encounter for supervision of normal pregnancy in multigravida; Thyroid dysfunction in pregnancy, antepartum, first trimester; Type O blood, Rh positive; and Low birth weight infant on their problem list.  Patient reports no complaints.  Contractions: Not present. Vag. Bleeding: None.  Movement: Present. Denies leaking of fluid/ROM.   The following portions of the patient's history were reviewed and updated as appropriate: allergies, current medications, past family history, past medical history, past social history, past surgical history and problem list. Problem list updated.  Objective:   Vitals:   12/15/18 1527  BP: 109/72  Temp: 97.6 F (36.4 C)  Weight: 164 lb 6.4 oz (74.6 kg)    Fetal Status: Fetal Heart Rate (bpm): 120 Fundal Height: 36 cm Movement: Present  Presentation: Vertex  General:  Alert, oriented and cooperative. Patient is in no acute distress.  Skin: Skin is warm and dry. No rash noted.   Cardiovascular: Normal heart rate noted  Respiratory: Normal respiratory effort, no problems with respiration noted  Abdomen: Soft, gravid, appropriate for gestational age.  Pain/Pressure: Absent     Pelvic: Cervical exam deferred        Extremities: Normal range of motion.  Edema: None  Mental Status: Normal mood and affect. Normal behavior. Normal judgment and thought content.   Assessment and Plan:  Pregnancy: G5P3003 at [redacted]w[redacted]d  1. Encounter for supervision of normal pregnancy in multigravida Feels well.  Knows when to go to L&D. Has car seat   Term labor symptoms and general obstetric precautions including but not limited to vaginal bleeding, contractions, leaking of fluid and fetal movement were reviewed in detail  with the patient. Please refer to After Visit Summary for other counseling recommendations.  Return in about 1 week (around 12/22/2018) for routine PNC.  No future appointments.  Herbie Saxon, CNM

## 2018-12-16 LAB — CHLAMYDIA/GC NAA, CONFIRMATION

## 2018-12-22 ENCOUNTER — Ambulatory Visit: Payer: Self-pay | Admitting: Advanced Practice Midwife

## 2018-12-22 ENCOUNTER — Other Ambulatory Visit: Payer: Self-pay

## 2018-12-22 VITALS — BP 111/68 | Temp 97.4°F | Wt 163.8 lb

## 2018-12-22 DIAGNOSIS — Z3480 Encounter for supervision of other normal pregnancy, unspecified trimester: Secondary | ICD-10-CM

## 2018-12-22 NOTE — Progress Notes (Signed)
   PRENATAL VISIT NOTE  Subjective:  Tamara Hahn is a 28 y.o. G5P3003 at [redacted]w[redacted]d being seen today for ongoing prenatal care.  She is currently monitored for the following issues for this low-risk pregnancy and has Family history of congenital anomalies; Encounter for supervision of normal pregnancy in multigravida; Thyroid dysfunction in pregnancy, antepartum, first trimester; Type O blood, Rh positive; and Low birth weight infant on their problem list.  Patient reports no complaints.  Contractions: Not present. Vag. Bleeding: None.  Movement: Present. Denies leaking of fluid/ROM.   The following portions of the patient's history were reviewed and updated as appropriate: allergies, current medications, past family history, past medical history, past social history, past surgical history and problem list. Problem list updated.  Objective:   Vitals:   12/22/18 1059  BP: 111/68  Temp: (!) 97.4 F (36.3 C)  Weight: 163 lb 12.8 oz (74.3 kg)    Fetal Status:   Fundal Height: 36 cm Movement: Present  Presentation: Vertex  General:  Alert, oriented and cooperative. Patient is in no acute distress.  Skin: Skin is warm and dry. No rash noted.   Cardiovascular: Normal heart rate noted  Respiratory: Normal respiratory effort, no problems with respiration noted  Abdomen: Soft, gravid, appropriate for gestational age.  Pain/Pressure: Absent     Pelvic: Cervical exam deferred        Extremities: Normal range of motion.  Edema: None  Mental Status: Normal mood and affect. Normal behavior. Normal judgment and thought content.   Assessment and Plan:  Pregnancy: G5P3003 at [redacted]w[redacted]d  1. Encounter for supervision of normal pregnancy in multigravida Provider to repeat GC/Chlamydia due to self collection last week=interfering substance  Knows when to go to L&D Ready for baby at home; has car seat.  Feels well - Chlamydia/GC NAA, Confirmation   Term labor symptoms and general obstetric  precautions including but not limited to vaginal bleeding, contractions, leaking of fluid and fetal movement were reviewed in detail with the patient. Please refer to After Visit Summary for other counseling recommendations.  No follow-ups on file.  No future appointments.  Herbie Saxon, CNM

## 2018-12-22 NOTE — Progress Notes (Signed)
Patient here for maternity visit at 82 5/7. Needs repeat GC/Chlam, due to problem with last sample.Jenetta Downer, RN

## 2018-12-23 NOTE — Addendum Note (Signed)
Addended by: Cletis Media on: 12/23/2018 09:15 AM   Modules accepted: Orders

## 2018-12-25 ENCOUNTER — Other Ambulatory Visit: Payer: Self-pay

## 2018-12-25 ENCOUNTER — Inpatient Hospital Stay
Admission: EM | Admit: 2018-12-25 | Discharge: 2018-12-26 | DRG: 807 | Disposition: A | Discharge status: Home / Self Care | Payer: Medicaid Other | Attending: Certified Nurse Midwife | Admitting: Certified Nurse Midwife

## 2018-12-25 ENCOUNTER — Encounter: Payer: Self-pay | Admitting: *Deleted

## 2018-12-25 DIAGNOSIS — Z20828 Contact with and (suspected) exposure to other viral communicable diseases: Secondary | ICD-10-CM | POA: Diagnosis present

## 2018-12-25 DIAGNOSIS — O26893 Other specified pregnancy related conditions, third trimester: Secondary | ICD-10-CM | POA: Diagnosis present

## 2018-12-25 DIAGNOSIS — Z3A39 39 weeks gestation of pregnancy: Secondary | ICD-10-CM

## 2018-12-25 DIAGNOSIS — Z3480 Encounter for supervision of other normal pregnancy, unspecified trimester: Secondary | ICD-10-CM

## 2018-12-25 LAB — CBC
HCT: 43.2 % (ref 36.0–46.0)
Hemoglobin: 14.7 g/dL (ref 12.0–15.0)
MCH: 27.8 pg (ref 26.0–34.0)
MCHC: 34 g/dL (ref 30.0–36.0)
MCV: 81.7 fL (ref 80.0–100.0)
Platelets: 212 10*3/uL (ref 150–400)
RBC: 5.29 MIL/uL — ABNORMAL HIGH (ref 3.87–5.11)
RDW: 14 % (ref 11.5–15.5)
WBC: 12.8 10*3/uL — ABNORMAL HIGH (ref 4.0–10.5)
nRBC: 0 % (ref 0.0–0.2)

## 2018-12-25 LAB — SARS CORONAVIRUS 2 BY RT PCR (HOSPITAL ORDER, PERFORMED IN ~~LOC~~ HOSPITAL LAB): SARS Coronavirus 2: NEGATIVE

## 2018-12-25 LAB — TYPE AND SCREEN
ABO/RH(D): O POS
Antibody Screen: NEGATIVE

## 2018-12-25 LAB — CHLAMYDIA/NGC RT PCR (ARMC ONLY)
Chlamydia Tr: NOT DETECTED
N gonorrhoeae: NOT DETECTED

## 2018-12-25 MED ORDER — COCONUT OIL OIL: 1.0000 "application " | TOPICAL_OIL | Status: DC | PRN

## 2018-12-25 MED ORDER — METHYLERGONOVINE MALEATE 0.2 MG/ML IJ SOLN: 0.2000 mg | INTRAMUSCULAR | Status: DC | PRN

## 2018-12-25 MED ORDER — IBUPROFEN 600 MG PO TABS
600.0000 mg | ORAL_TABLET | Freq: Four times a day (QID) | ORAL | Status: DC
  Administered 2018-12-25 – 2018-12-26 (×5): 600 mg via ORAL
  Filled 2018-12-25 (×5): qty 1

## 2018-12-25 MED ORDER — OXYCODONE-ACETAMINOPHEN 5-325 MG PO TABS: 2.0000 | ORAL_TABLET | ORAL | Status: DC | PRN

## 2018-12-25 MED ORDER — OXYCODONE HCL 5 MG PO TABS: 5.0000 mg | ORAL_TABLET | ORAL | Status: DC | PRN

## 2018-12-25 MED ORDER — LIDOCAINE HCL (PF) 1 % IJ SOLN: 30.0000 mL | INTRAMUSCULAR | Status: DC | PRN

## 2018-12-25 MED ORDER — WITCH HAZEL-GLYCERIN EX PADS: 1.0000 "application " | MEDICATED_PAD | CUTANEOUS | Status: DC | PRN

## 2018-12-25 MED ORDER — ONDANSETRON HCL 4 MG PO TABS: 4.0000 mg | ORAL_TABLET | ORAL | Status: DC | PRN

## 2018-12-25 MED ORDER — MISOPROSTOL 200 MCG PO TABS: 1000.0000 ug | ORAL_TABLET | Freq: Once | ORAL | Status: DC | PRN

## 2018-12-25 MED ORDER — OXYTOCIN 10 UNIT/ML IJ SOLN
10.0000 [IU] | Freq: Once | INTRAMUSCULAR | Status: AC
  Administered 2018-12-25: 10 [IU] via INTRAMUSCULAR

## 2018-12-25 MED ORDER — SOD CITRATE-CITRIC ACID 500-334 MG/5ML PO SOLN: 30.0000 mL | ORAL | Status: DC | PRN

## 2018-12-25 MED ORDER — ONDANSETRON HCL 4 MG/2ML IJ SOLN: 4.0000 mg | Freq: Four times a day (QID) | INTRAMUSCULAR | Status: DC | PRN

## 2018-12-25 MED ORDER — METHYLERGONOVINE MALEATE 0.2 MG PO TABS: 0.2000 mg | ORAL_TABLET | ORAL | Status: DC | PRN

## 2018-12-25 MED ORDER — OXYCODONE-ACETAMINOPHEN 5-325 MG PO TABS: 1.0000 | ORAL_TABLET | ORAL | Status: DC | PRN

## 2018-12-25 MED ORDER — BUTORPHANOL TARTRATE 1 MG/ML IJ SOLN: 1.0000 mg | INTRAMUSCULAR | Status: DC | PRN

## 2018-12-25 MED ORDER — BENZOCAINE-MENTHOL 20-0.5 % EX AERO: 1.0000 "application " | INHALATION_SPRAY | CUTANEOUS | Status: DC | PRN

## 2018-12-25 MED ORDER — DIBUCAINE (PERIANAL) 1 % EX OINT: 1.0000 "application " | TOPICAL_OINTMENT | CUTANEOUS | Status: DC | PRN

## 2018-12-25 MED ORDER — ONDANSETRON HCL 4 MG/2ML IJ SOLN: 4.0000 mg | INTRAMUSCULAR | Status: DC | PRN

## 2018-12-25 MED ORDER — SENNOSIDES-DOCUSATE SODIUM 8.6-50 MG PO TABS
2.0000 | ORAL_TABLET | ORAL | Status: DC
  Administered 2018-12-25: 23:00:00 2 via ORAL
  Filled 2018-12-25: qty 2

## 2018-12-25 MED ORDER — SIMETHICONE 80 MG PO CHEW: 80.0000 mg | CHEWABLE_TABLET | ORAL | Status: DC | PRN

## 2018-12-25 MED ORDER — DIPHENHYDRAMINE HCL 25 MG PO CAPS: 25.0000 mg | ORAL_CAPSULE | Freq: Four times a day (QID) | ORAL | Status: DC | PRN

## 2018-12-25 MED ORDER — OXYCODONE HCL 5 MG PO TABS: 10.0000 mg | ORAL_TABLET | ORAL | Status: DC | PRN

## 2018-12-25 MED ORDER — PRENATAL MULTIVITAMIN CH
1.0000 | ORAL_TABLET | Freq: Every day | ORAL | Status: DC
  Administered 2018-12-26: 1 via ORAL
  Filled 2018-12-25: qty 1

## 2018-12-25 MED ORDER — ACETAMINOPHEN 325 MG PO TABS: 650.0000 mg | ORAL_TABLET | ORAL | Status: DC | PRN

## 2018-12-25 MED ORDER — ACETAMINOPHEN 325 MG PO TABS
650.0000 mg | ORAL_TABLET | ORAL | Status: DC | PRN
  Administered 2018-12-25: 14:00:00 650 mg via ORAL
  Filled 2018-12-25: qty 2

## 2018-12-25 NOTE — H&P (Signed)
Obstetric History and Physical  Tamara Hahn is a 28 y.o. [phone] with IUP at 2836w1d presenting with contractions, leakage, and pelvic pressure.   Patient states she has been having regular contractions since 1000, minimal vaginal bleeding, clear fluid membranes since 1200, with active fetal movement.    Denies difficulty breathing or respiratory distress, chest pain, dysuria, and leg pain or swelling.   Prenatal Course  [facility], Delivering Provider[facility], [facility]  Pregnancy complications or risks:Thyroid dysfunction in pregnancy, Rh positive, Family history of congenital anomalies  Prenatal labs and studies:  ABO, Rh: O/Positive/O positive (03/02 0000)  Antibody: Negative (03/02 0000)  Rubella: Immune (03/02 0000)  RPR: Non Reactive (07/08 1409)   HBsAg: Negative (03/02 0000)   HIV: Non-reactive (07/08 0000)   AVW:UJWJXBJY/GBS:Negative/-- (09/02 0830)  1 hr Glucola: 84 (07/09 0739)  Genetic screening: Negative Quad Screen (04/24)  Anatomy US: Complete, normal (05/28 [id number])  Past Medical History:  Diagnosis Date  . Hx of migraines   . Post-dates pregnancy 10/25/2014  . UTI (urinary tract infection)    x1    Past Surgical History:  Procedure Laterality Date  . no surgical hx      OB History  Gravida Para Term Preterm AB Living  5 3 3     3   SAB TAB Ectopic Multiple Live Births        0 3    # Outcome Date GA Lbr Len/2nd Weight Sex Delivery Anes PTL Lv  5 Current           4 Term 10/26/14 2132w6d / 00:23 3275 g F Vag-Spont None N LIV  3 Term 07/06/10 3729w4d  2268 g M Vag-Spont None N LIV  2 Term 10/24/07 762w4d  3629 g M Vag-Spont None N LIV  1 Gravida             Social History   Socioeconomic History  . Marital status: Married    Spouse name: Lewis  . Number of children: Not on file  . Years of education: Not on file  . Highest education level: Not on file  Occupational History  . Not on file   Social Needs  . Financial resource strain: Not on file  . Food insecurity    Worry: Not on file    Inability: Not on file  . Transportation needs    Medical: Not on file    Non-medical: Not on file  Tobacco Use  . Smoking status: Never Smoker  . Smokeless tobacco: Never Used  Substance and Sexual Activity  . Alcohol use: Not Currently    Alcohol/week: 0.0 standard drinks  . Drug use: No  . Sexual activity: Yes    Partners: Male  Lifestyle  . Physical activity    Days per week: Not on file    Minutes per session: Not on file  . Stress: Not on file  Relationships  . Social Musicianconnections    Talks on phone: Not on file    Gets together: Not on file    Attends religious service: Not on file    Active member of club or organization: Not on file    Attends meetings of clubs or organizations: Not on file    Relationship status: Not on file  Other Topics Concern  . Not on file  Social History Narrative  . Not on file    Family History  Problem Relation Age of Onset  . Multiple births  Mother     Medications Prior to Admission  Medication Sig Dispense Refill Last Dose  . Prenatal Vit-Fe Fumarate-FA (PRENATAL MULTIVITAMIN) TABS tablet Take 1 tablet by mouth daily at 12 noon. 50 tablet 1     No Known Allergies  Review of Systems: Negative except for what is mentioned in HPI.  Physical Exam:  BP 117/66   Pulse 70   Temp 98.1 F (36.7 C) (Oral)   LMP 03/26/2018   GENERAL: Well-developed, well-nourished female in no acute distress.   LUNGS: Clear to auscultation bilaterally.   HEART: Regular rate and rhythm.  ABDOMEN: Soft, nontender, nondistended, gravid.  EXTREMITIES: Nontender, no edema, 2+ distal pulses.  Cervical Exam: 10/100/+3, vertex  Fetal Wellbeing: Category I  Contractions: Every one (1) to three (3) minutes, soft resting tone   Pertinent Labs/Studies:    No results found for this or any previous visit (from the past 24 hour(s)).  Assessment  :  Tamara Hahn is a 28 y.o. [phone] at [redacted]w[redacted]d being admitted for labor, Rh positive, GBS negative  FHR Category I  Plan:  Admit to birthing suites, see orders.   Delivery plan: Hopeful for vaginal delivery.   Dr. Marcelline Mates notified of admission and plan of care.    Diona Fanti, CNM [facility], [facility] 12/25/18 1:25 PM

## 2018-12-25 NOTE — Lactation Note (Signed)
This note was copied from a baby's chart. Lactation Consultation Note  Patient Name: Tamara Hahn [id number] Date: 12/25/2018 Reason for consult: Initial assessment;Term;Other (Comment)(Tight frenulum near tip of tongue)  Assisted mom with breast feeding and lactation education done through [place] interpreter.  Observed mom latching [name] without assistance with strong rhythmic sucking.  Heart shaped tongue noted d/t tight frenulum near tip of tongue.  Pointed out to mom and discussed potential risks to breast feeding.  Parents do not know of anyone else in her or FOB's family that has a tongue tie.  Mom denies any breast or nipple pain when breast feeding.  Mom reports breast feeding other 3 for around a month and giving up d/t perceived low milk supply.  Mom gave bottles of formula and breast fed from the beginning with others.  Explained supply and demand and need to breast feed only in beginning to bring in mature milk and ensure a plentiful milk supply.  Explained risks of introducing bottles of formula too early to successful breast feeding.  Reviewed feeding cues, newborn stomach size, normal course of lactation and routine newborn feeding patterns.  Lactation name and number written on white board and encouraged to call with any questions, concerns or assistance.   Maternal Data Formula Feeding for Exclusion: No Has patient been taught Hand Expression?: Yes(Demonstrated to mom that she has colostrum) Does the patient have breastfeeding experience prior to this delivery?: Yes  Feeding Feeding Type: Breast Fed  LATCH Score Latch: Repeated attempts needed to sustain latch, nipple held in mouth throughout feeding, stimulation needed to elicit sucking reflex.  Audible Swallowing: A few with stimulation  Type of Nipple: Everted at rest and after stimulation  Comfort (Breast/Nipple): Soft / non-tender  Hold (Positioning): No assistance needed to correctly position infant  at breast.  LATCH Score: 8  Interventions Interventions: Breast feeding basics reviewed;Hand express;Support pillows  Lactation Tools Discussed/Used WIC Program: (Will f/u with Dr. Ardis Hughs )   Consult Status Consult Status: Follow-up Follow-up type: Call as needed    Jarold Motto 12/25/2018, 7:26 PM

## 2018-12-26 ENCOUNTER — Encounter: Payer: Self-pay | Admitting: Certified Nurse Midwife

## 2018-12-26 LAB — CBC
HCT: 35.2 % — ABNORMAL LOW (ref 36.0–46.0)
Hemoglobin: 11.9 g/dL — ABNORMAL LOW (ref 12.0–15.0)
MCH: 27.7 pg (ref 26.0–34.0)
MCHC: 33.8 g/dL (ref 30.0–36.0)
MCV: 81.9 fL (ref 80.0–100.0)
Platelets: 211 10*3/uL (ref 150–400)
RBC: 4.3 MIL/uL (ref 3.87–5.11)
RDW: 14.1 % (ref 11.5–15.5)
WBC: 15.6 10*3/uL — ABNORMAL HIGH (ref 4.0–10.5)
nRBC: 0 % (ref 0.0–0.2)

## 2018-12-26 LAB — RPR: RPR Ser Ql: NONREACTIVE

## 2018-12-26 MED ORDER — MEDROXYPROGESTERONE ACETATE 150 MG/ML IM SUSP: 150.0000 mg | INTRAMUSCULAR | 3 refills | Status: DC

## 2018-12-26 MED ORDER — MEDROXYPROGESTERONE ACETATE 150 MG/ML IM SUSP
150.0000 mg | Freq: Once | INTRAMUSCULAR | Status: AC
  Administered 2018-12-26: 150 mg via INTRAMUSCULAR
  Filled 2018-12-26: qty 1

## 2018-12-26 MED ORDER — IBUPROFEN 600 MG PO TABS: 600.0000 mg | ORAL_TABLET | Freq: Four times a day (QID) | ORAL | 0 refills | Status: DC

## 2018-12-26 MED ORDER — ACETAMINOPHEN 325 MG PO TABS: 650.0000 mg | ORAL_TABLET | ORAL | 0 refills | Status: AC | PRN

## 2018-12-26 MED ORDER — INFLUENZA VAC SPLIT QUAD 0.5 ML IM SUSY: 0.5000 mL | PREFILLED_SYRINGE | INTRAMUSCULAR | Status: DC

## 2018-12-26 NOTE — Discharge Summary (Signed)
Obstetric Discharge Summary  Patient ID: Tamara Hahn MRN: [id number] DOB/AGE: 05-28-1990 28 y.o.   Date of Admission: 12/25/2018  Date of Discharge:  12/26/18  Admitting Diagnosis: Onset of Labor at 5567w1d, precipitous birth   Secondary Diagnosis: Thyroid dysfunction in pregnancy, Rh positive, Family history of congenital anomalies, Previous infant with low birth weight  Mode of Delivery: Normal spontaneous vaginal delivery     Discharge Diagnosis: No other diagnosis   Intrapartum Procedures: None   Post partum procedures: None  Complications: None   Brief Hospital Course   Tamara Hahn is a [id number] who had a SVD on 12/25/2018;  for further details of this birth, please refer to the delivey summary.  Patient had an uncomplicated postpartum course.  By time of discharge on PPD#1, her pain was controlled on oral pain medications; she had appropriate lochia and was ambulating, voiding without difficulty and tolerating regular diet.  She was deemed stable for discharge to home.    Labs: CBC Latest Ref Rng & Units 12/26/2018 12/25/2018 06/07/2018  WBC 4.0 - 10.5 K/uL 15.6(H) 12.8(H) -  Hemoglobin 12.0 - 15.0 g/dL 11.9(L) 14.7 12.5  Hematocrit 36.0 - 46.0 % 35.2(L) 43.2 36  Platelets 150 - 400 K/uL 211 212 317,000   O POS  Physical exam:   Temp:  [98.1 F (36.7 C)-98.7 F (37.1 C)] 98.4 F (36.9 C) (09/20 0821) Pulse Rate:  [70-85] 76 (09/20 0821) Resp:  [18-20] 20 (09/20 0821) BP: (99-126)/(64-85) 117/85 (09/20 0821) SpO2:  [99 %-100 %] 100 % (09/20 0821) Weight:  [74.4 kg] 74.4 kg (09/19 1350)  General: alert and no distress  Lochia: appropriate  Abdomen: soft, NT  Uterine Fundus: firm  Perineum: no dehiscence, no significant erythema  Extremities: No evidence of DVT seen on physical exam. No lower extremity edema  Edinburgh Postnatal Depression Scale Screening Tool 12/26/2018 12/25/2018  I have been able to laugh and see the funny side of things. 0  (No Data)  I have looked forward with enjoyment to things. 0 -  I have blamed myself unnecessarily when things went wrong. 0 -  I have been anxious or worried for no good reason. 0 -  I have felt scared or panicky for no good reason. 0 -  Things have been getting on top of me. 0 -  I have been so unhappy that I have had difficulty sleeping. 3 -  I have felt sad or miserable. 0 -  I have been so unhappy that I have been crying. 0 -  The thought of harming myself has occurred to me. 0 -  Edinburgh Postnatal Depression Scale Total 3 -     Discharge Instructions: Per After Visit Summary  Activity: Advance as tolerated. Pelvic rest for 6 weeks.  Also refer to After Visit Summary  Diet: Regular  Medications: Allergies as of 12/26/2018   No Known Allergies     Medication List    TAKE these medications   acetaminophen 325 MG tablet Commonly known as: Tylenol Take 2 tablets (650 mg total) by mouth every 4 (four) hours as needed (for pain scale < 4).   ibuprofen 600 MG tablet Commonly known as: ADVIL Take 1 tablet (600 mg total) by mouth every 6 (six) hours.   medroxyPROGESTERone 150 MG/ML injection Commonly known as: DEPO-PROVERA Inject 1 mL (150 mg total) into the muscle every 3 (three) months.   prenatal multivitamin Tabs tablet Take 1 tablet by mouth daily at 12 noon.  Outpatient follow up:  Follow-up Information    Caren Macadam, MD Follow up.   Specialty: Obstetrics and Gynecology Why: Please schedule six (6) week postpartum vist at [facility] Contact information: [place] [address] [phone]          Postpartum contraception: Depo-Provera, first dose given prior to discharge  Discharged Condition: stable  Discharged to: home   Newborn Data:  Disposition:home with mother  Apgars: APGAR (1 MIN): 7   APGAR (5 MINS): 9    Baby Feeding: Breast with formula supplementation   Diona Fanti,  CNM [facility], [facility] 12/26/18 12:26 PM

## 2018-12-26 NOTE — Progress Notes (Signed)
Pt discharged with infant.  Discharge instructions, prescriptions and follow up appointment given to and reviewed with pt via interpreter. Pt verbalized understanding. Escorted out by auxillary. 

## 2018-12-26 NOTE — Discharge Instructions (Signed)
Parto vaginal, cuidados de puerperio Postpartum Care After Vaginal Delivery Lea esta informacin sobre cmo cuidarse desde el momento en que nazca su beb y hasta 6 a 12 semanas despus del parto (perodo del posparto). El mdico tambin podr darle instrucciones ms especficas. Comunquese con su mdico si tiene problemas o preguntas. Siga estas indicaciones en su casa: Hemorragia vaginal  Es normal tener un poco de hemorragia vaginal (loquios) despus del parto. Use un apsito sanitario para el sangrado vaginal y secrecin. ? Durante la primera semana despus del parto, la cantidad y el aspecto de los loquios a menudo es similar a las del perodo menstrual. ? Durante las siguientes semanas disminuir gradualmente hasta convertirse en una secrecin seca amarronada o amarillenta. ? En la mayora de las mujeres, los loquios se detienen completamente entre 4 a 6semanas despus del parto. Los sangrados vaginales pueden variar de mujer a mujer.  Cambie los apsitos sanitarios con frecuencia. Observe si hay cambios en el flujo, como: ? Un aumento repentino en el volumen. ? Cambio en el color. ? Cogulos sanguneos grandes.  Si expulsa un cogulo de sangre por la vagina, gurdelo y llame al mdico para informrselo. No deseche los cogulos de sangre por el inodoro antes de hablar con su mdico.  No use tampones ni se haga duchas vaginales hasta que el mdico la autorice.  Si no est amamantando, volver a tener su perodo entre 6 y 8 semanas despus del parto. Si solamente alimenta al beb con leche materna (lactancia materna exclusiva), podra no volver a tener su perodo hasta que deje de amamantar. Cuidados perineales  Mantenga la zona entre la vagina y el ano (perineo) limpia y seca, como se lo haya indicado el mdico. Utilice apsitos o aerosoles analgsicos y cremas, como se lo hayan indicado.  Si le hicieron un corte en el perineo (episiotoma) o tuvo un desgarro en la vagina, controle la  zona para detectar signos de infeccin hasta que sane. Est atenta a los siguientes signos: ? Aumento del enrojecimiento, la hinchazn o el dolor. ? Presenta lquido o sangre que supura del corte o desgarro. ? Calor. ? Pus o mal olor.  Es posible que le den una botella rociadora para que use en lugar de limpiarse el rea con papel higinico despus de usar el bao. Cuando comience a sanar, podr usar la botella rociadora antes de secarse. Asegrese de secarse suavemente.  Para aliviar el dolor causado por una episiotoma, un desgarro en la vagina o venas hinchadas en el ano (hemorroides), trate de tomar un bao de asiento tibio 2 o 3 veces por da. Un bao de asiento es un bao de agua tibia que se toma mientras se est sentado. El agua solo debe llegar hasta las caderas y cubrir las nalgas. Cuidado de las mamas  En los primeros das despus del parto, las mamas pueden sentirse pesadas, llenas e incmodas (congestin mamaria). Tambin puede escaparse leche de sus senos. El mdico puede sugerirle mtodos para aliviar este malestar. La congestin mamaria debera desaparecer al cabo de unos das.  Si est amamantando: ? Use un sostn que sujete y ajuste bien sus pechos. ? Mantenga los pezones secos y limpios. Aplquese cremas y ungentos, como se lo haya indicado el mdico. ? Es posible que deba usar discos de algodn en el sostn para absorber la leche que se filtre de sus senos. ? Puede tener contracciones uterinas cada vez que amamante durante varias semanas despus del parto. Las contracciones uterinas ayudan al tero a   regresar a su tamao habitual. ? Si tiene algn problema con la lactancia materna, colabore con el mdico o un asesor en lactancia.  Si no est amamantando: ? Evite tocarse mucho las mamas. Al hacerlo, podran producir ms leche. ? Use un sostn que le proporcione el ajuste correcto y compresas fras para reducir la hinchazn. ? No extraiga (saque) leche materna. Esto har que  produzca ms leche. Intimidad y sexualidad  Pregntele al mdico cundo puede retomar la actividad sexual. Esto puede depender de lo siguiente: ? Su riesgo de sufrir infecciones. ? La rapidez con la que est sanando. ? Su comodidad y deseo de retomar la actividad sexual.  Despus del parto, puede quedar embarazada incluso si no ha tenido todava su perodo. Si lo desea, hable con el mdico acerca de los mtodos de control de la natalidad (mtodos anticonceptivos). Medicamentos  Tome los medicamentos de venta libre y los recetados solamente como se lo haya indicado el mdico.  Si le recetaron un antibitico, tmelo como se lo haya indicado el mdico. No deje de tomar el antibitico aunque comience a sentirse mejor. Actividad  Retome sus actividades normales de a poco como se lo haya indicado el mdico. Pregntele al mdico qu actividades son seguras para usted.  Descanse todo lo que pueda. Trate de descansar o tomar una siesta mientras el beb duerme. Comida y bebida   Beba suficiente lquido como para mantener la orina de color amarillo plido.  Coma alimentos ricos en fibras todos los das. Estos pueden ayudarla a prevenir o aliviar el estreimiento. Los alimentos ricos en fibras incluyen, entre otros: ? Panes y cereales integrales. ? Arroz integral. ? Frijoles. ? Frutas y verduras frescas.  No intente perder de peso rpidamente reduciendo el consumo de caloras.  Tome sus vitaminas prenatales hasta la visita de seguimiento de posparto o hasta que su mdico le indique que puede dejar de tomarlas. Estilo de vida  No consuma ningn producto que contenga nicotina o tabaco, como cigarrillos y cigarrillos electrnicos. Si necesita ayuda para dejar de fumar, consulte al mdico.  No beba alcohol, especialmente si est amamantando. Instrucciones generales  Concurra a todas las visitas de seguimiento para usted y el beb, como se lo haya indicado el mdico. La mayora de las mujeres  visita al mdico para un seguimiento de posparto dentro de las primeras 3 a 6 semanas despus del parto. Comunquese con un mdico si:  Se siente incapaz de controlar los cambios que implica tener un hijo y esos sentimientos no desaparecen.  Siente tristeza o preocupacin de forma inusual.  Las mamas se ponen rojas, le duelen o se endurecen.  Tiene fiebre.  Tiene dificultad para retener la orina o para impedir que la orina se escape.  Tiene poco inters o falta de inters en actividades que solan gustarle.  No ha amamantado nada y no ha tenido un perodo menstrual durante 12 semanas despus del parto.  Dej de amamantar al beb y no ha tenido su perodo menstrual durante 12 semanas despus de dejar de amamantar.  Tiene preguntas sobre su cuidado y el del beb.  Elimina un cogulo de sangre grande por la vagina. Solicite ayuda de inmediato si:  Siente dolor en el pecho.  Tiene dificultad para respirar.  Tiene un dolor repentino e intenso en la pierna.  Tiene dolor intenso o clicos en el la parte inferior del abdomen.  Tiene una hemorragia tan intensa de la vagina que empapa ms de un apsito en una hora. El sangrado   no debe ser ms abundante que el perodo ms intenso que haya tenido.  Dolor de cabeza intenso.  Se desmaya.  Tiene visin borrosa o Nurse, adultmanchas en la vista.  Tiene secrecin vaginal con mal olor.  Tiene pensamientos acerca de lastimarse a usted misma o a su beb. Si alguna vez siente que puede lastimarse a usted misma o a Economistotras personas, o tiene pensamientos de poner fin a su vida, busque ayuda de inmediato. Puede dirigirse al departamento de emergencias ms cercano o llamar a:  El servicio de emergencias de su localidad (911 en [place]  Una lnea de asistencia al suicida y Visual merchandiseratencin en crisis, como la Murphy OilLnea Nacional de Prevencin del Suicidio (National Suicide Prevention Lifeline), al [phone]. Est disponible las 24 horas del da. Resumen  El  perodo de tiempo justo despus el parto y Elaina Hoopshasta 6 a 12 semanas despus del parto se denomina perodo posparto.  Retome sus actividades normales de a Naval architectpoco como se lo haya indicado el mdico.  Concurra a todas las visitas de seguimiento para usted y Dance movement psychotherapistel beb, como se lo haya indicado el mdico. Esta informacin no tiene Theme park managercomo fin reemplazar el consejo del mdico. Asegrese de hacerle al mdico cualquier pregunta que tenga. Document Released: 01/19/2007 Document Revised: 07/04/2017 Document Reviewed: 03/15/2017 Elsevier Patient Education  2020 [employer]. Depresin posparto Postpartum Baby Blues El perodo del posparto comienza inmediatamente despus del nacimiento de un beb. Este tiempo suele ser una poca de gran felicidad y mucho entusiasmo. Tambin es tiempo de muchos cambios en la vida de [place] padres. Independientemente de cuntas veces una madre d a luz, cada nio trae nuevos desafos a la familia, incluidas nuevas formas de relacionarse con los dems. Es frecuente tener sentimientos de entusiasmo y, a la vez, cambios desconcertantes en el estado de nimo, las emociones y los pensamientos. Es posible que se sienta feliz un minuto y Austriatriste o estresada el siguiente. Estos sentimientos de tristeza suelen ocurrir inmediatamente despus del nacimiento y Electronics engineerdesaparecen en un lapso de una o Marsh & McLennandos semanas. Esto se llama depresin posparto. Cules son las causas? La depresin posparto no tiene una causa conocida. Probablemente sea consecuencia de una combinacin de factores. Sin embargo, se cree que los Affiliated Computer Servicescambios en los niveles hormonales ocurridos despus del nacimiento desencadenan algunos de los sntomas. Otros factores que pueden afectar estos cambios anmicos incluyen los siguientes:  Falta de sueo.  Sucesos estresantes de la vida, como la pobreza, el cuidado de un ser querido o la muerte de un ser querido.  Factores genticos. Cules son los signos o los sntomas? Los sntomas de esta afeccin  incluyen los siguientes:  Cambios en el estado de nimo durante lapsos breves, como pasar de la felicidad extrema a la tristeza.  Falta de concentracin.  Dificultad para dormir.  Ataques de llanto y sensibilidad emocional.  Prdida del apetito.  Irritabilidad.  Ansiedad. Si los sntomas de la depresin posparto duran ms de 2semanas o Eareckson Stationempeoran, podra tener una forma ms grave de depresin posparto. Cmo se diagnostica? Esta afeccin se diagnostica en funcin de una evaluacin de los sntomas. No hay exmenes mdicos ni pruebas de laboratorio que Financial plannerpermitan hacer un diagnstico, pero hay varios cuestionarios que el mdico podra usar para determinar si una mujer tiene depresin posparto o una forma ms grave de esta. Cmo se trata? No se requiere tratamiento para esta afeccin. Generalmente, la depresin posparto desaparece sin tratamiento en el trmino de 1 o 2semanas. A menudo, todo lo que se necesita es apoyo social. Se  le recomendar que duerma y descanse lo suficiente. Siga estas indicaciones en su casa: Estilo de vida      Descanse todo lo posible. Tome una siesta cuando el beb duerma.  Haga actividad fsica habitualmente como se lo haya indicado el mdico. Para algunas mujeres, el yoga y las caminatas son tiles.  Consuma una dieta equilibrada y nutritiva. Esto incluye muchas frutas y verduras, cereales integrales y protenas magras.  Haga pequeas cosas que disfruta. Tome una taza de t, dese un bao de burbujas, lea su revista favorita o escuche su msica predilecta.  Evite el alcohol.  Pida ayuda con las tareas domsticas, la cocina, las compras, o las obligaciones diarias. No intente hacer todo usted misma. Considere la posibilidad de contratar a Neomia Dear doula para que la ayude y Psychiatric nurse. Se trata de una profesional que se especializa en asistir a nuevas madres.  Intente no hacer ningn cambio importante durante el embarazo ni inmediatamente despus del nacimiento.  Esto podra sumar estrs. Instrucciones generales  Hable con personas allegadas sobre cmo se siente. Busque el apoyo de su pareja, sus familiares, sus amigos o de [employer] primerizas. Quiz Remus Blake a un grupo de apoyo.  Encuentre formas de lidiar con Development worker, community. Esto puede incluir lo siguiente: ? Escribir sus pensamientos y sentimientos en un diario. ? Pasar tiempo al [employer]. ? Pasar tiempo con personas que la hagan rer.  Intente pensar positivamente. Piense en aquellas cosas por las que se siente agradecida.  Tome los medicamentos de venta libre y los recetados solamente como se lo haya indicado el mdico.  Infrmele a su mdico sobre cualquier inquietud que tenga.  Concurrir a todas las visitas durante el posparto tal como se lo haya indicado el mdico. Esto es importante. Comunquese con un mdico si:  La depresin posparto no desaparece despus de 2semanas. Solicite ayuda de inmediato si:  Tiene pensamientos sobre [employer] vida (pensamientos suicidas).  Cree que podra lastimar al beb o a Economist.  Ve u oye cosas que no existen (alucinaciones). Resumen  Despus de dar a luz, es posible que se sienta feliz un minuto y [place] o estresada el siguiente. Los sentimientos de tristeza que ocurren inmediatamente despus de que el beb nace y desaparecen luego de una o dos semanas se llaman depresin posparto.  Puede controlar la depresin posparto descansando lo suficiente, siguiendo una dieta saludable, realizando actividad fsica, pasando tiempo con personas que le den apoyo y encontrando modos de lidiar con Development worker, community.  Si los sentimientos de tristeza o estrs duran ms de 2semanas o perjudican el cuidado que le brinda al beb, hable con el mdico. Esto podra significar que tiene un caso ms grave de depresin posparto. Esta informacin no tiene Theme park manager el consejo del mdico. Asegrese de hacerle al mdico cualquier pregunta que  tenga. Document Released: 09/10/2007 Document Revised: 06/22/2017 Document Reviewed: 12/05/2016 Elsevier Patient Education  2020 Elsevier Inc. [place] materna y frenillo lingual corto Breastfeeding and Tongue Tie  La lactancia puede ser un desafo, especialmente, en las primeras semanas despus de dar a luz. Al comenzar con la lactancia del nuevo beb, es normal que surjan algunos problemas, incluso si ya amamant antes. El frenillo lingual corto es una afeccin con la cual nacen algunos bebs (congnita). Con esta afeccin, el beb no puede mover libremente la lengua debido a que la banda de tejido que conecta la lengua con la base de la boca (frenillo) es muy corta o est muy tirante. [place] Finlay  la lengua en forma de corazn puede ser un signo de que el nio tiene el frenillo Engineer, building services. En algunos casos, el frenillo lingual corto puede interferir a la hora de Economist. Cmo me afecta esto a m? Si su beb tiene el frenillo lingual corto, es posible que tenga problemas para prenderse a los pezones de [place] adecuada para [place]. Como consecuencia, es posible que usted tenga los siguientes problemas:  Pezones plegados, planos o blancos (plidos) despus de Economist.  Pezones irritados o Social research officer, government.  Molestias mientras amamanta.  Obstruccin de los Therapist, sports.  Mamas saturadas de leche (congestin [place]).  [place].  Inflamacin o infeccin de las mamas.  Falta de sueo. Esto puede ocurrir porque el beb no se [employer], de modo que, para satisfacerlo, la madre suele amamantarlo con ms frecuencia, incluso durante la noche. Cmo afecta esto al beb? Muchos bebs con el frenillo lingual corto pueden amamantarse eficazmente sin problemas. Si la afeccin del beb causa problemas, los sntomas pueden incluir, entre otros, los siguientes:  Dificultad para prenderse al pezn.  Morder el pezn con las encas o mordisquearlo mientras se  [place].  Incapacidad de vaciar la mama por completo.  Flatulencias. Los bebs con frenillo lingual corto suelen tragar mucho aire porque no pueden succionar correctamente.  Escaso aumento de [place].  Babeo excesivo.  Ahogarse con [employer] o desprenderse de la mama para tomar aire mientras se [place].  [place] y despertarse poco despus para amamantarse nuevamente.  Falta de sueo. Esto se debe a la necesidad de alimentarse con frecuencia.  Largas sesiones de amamantamiento.  Imposibilidad de sostener un chupete o un bibern.  Chasquidos mientras succiona.  Prenderse y desprenderse frecuentemente de la mama. Cmo se trata? Si el frenillo lingual corto le provoca problemas al beb para Biomedical scientist, debe trabajar con un especialista en lactancia (asesor en Transport planner) para buscar posiciones y Librarian, academic que la ayuden a asegurarse de que el beb se prenda bien. A muchos bebs se les quita esta afeccin a medida que crecen. Si es necesario, se puede hacer un pequeo corte en el frenillo (frenotoma). Este procedimiento permitir que la lengua se mueva libremente. Siga estas indicaciones en su casa: Trabaje con un asesor en lactancia para asegurarse de que el beb se pueda alimentar bien. Esto puede incluir lo siguiente:  Pension scheme manager posiciones para amamantar que funcionen bien tanto para usted como para el beb.  Realizar pasos para ayudar al beb a prenderse. Para ayudar a que el beb se prenda, siga estos pasos: ? Empuje suavemente los labios del beb con el pezn o con el dedo. ? Cuando la boca del beb se abra lo suficiente, acrquelo rpidamente a la mama e introduzca todo el pezn, y la mayor parte posible de la arola, dentro de la boca del beb. La arola es la zona de color que rodea al pezn. ? La lengua del beb debe estar entre la enca inferior y la [place]. ? Debe haber ms arola visible por arriba del labio superior del beb que por debajo del labio  inferior. ? Cuando el beb comience a Mining engineer, sentir un suave tirn del pezn, pero no Network engineer. Tenga paciencia. Es comn que el beb succione durante 2 o 31minutos antes de que comience el flujo de [place]. ? Asegrese de que la boca del beb est en la posicin correcta alrededor del pezn. Los labios del beb deben crear un sello sobre la mama y estar doblados hacia afuera (invertidos  hacia fuera).  Controle los siguientes signos de que el beb se ha prendido adecuadamente al pezn: ? El beb tironea o succiona con tranquilidad, sin causarle dolor. ? Oye que el beb traga despus de succionar 3 o 4 veces. ? Ve movimiento muscular por arriba y delante de las orejas del beb mientras succiona.  Est atenta a estos signos de que el beb no se ha prendido adecuadamente al pezn: ? El beb hace ruidos de succin, de clic o de chasquido mientras amamanta. ? Tiene dolor en los pezones.  Si el beb no est bien prendido, inserte el meique entre las encas del beb y el pezn para romper el sellado. LuegMuseum/gallery conservatoro, intente ayudar al beb a prenderse otra vez. Comunquese con un mdico si:  El beb no [place]othoaumenta de peso o [place] Lakes[place]gaza.  Usted presenta agrietamiento o irritacin en los pezones durante ms de 1semana.  Tiene dolor en los pezones. Si bien es frecuente DIRECTVtener los pezones irritados y agrietados durante la primera semana posterior al nacimiento, Chief Technology Officerel dolor en los pezones nunca es normal.  Tiene congestin mamaria que no mejora despus de 48 a 72horas.  Tiene una obstruccin en un conducto galactforo y Fowlerfiebre.  Sigue los consejos para un buen agarre, pero contina teniendo problemas o inquietudes.  Le Dance movement psychotherapistsale una secrecin similar al pus por la mama. Solicite ayuda de inmediato si:  Su beb no moja ni ensucia los paales con la frecuencia que el mdico le dijo que era esperable. Resumen  El frenillo lingual corto es una afeccin en la cual el beb no puede mover libremente  la lengua debido a que la banda de tejido que conecta la lengua con la base de la boca (frenillo) es muy corta o est muy tirante.  Si un beb tiene el frenillo lingual corto, Museum/gallery exhibitions officeramamantar puede ser doloroso para la madre y es posible que el beb tenga problemas para obtener suficiente [place]che materna. Esto puede llevar a un aumento de peso lento.  Muchos bebs con el frenillo lingual corto pueden amamantarse eficazmente sin problemas. Othella Boyerbtenga ayuda de un especialista en lactancia desde el principio para evitar que ocurran problemas. Esta informacin no tiene Theme park managercomo fin reemplazar el consejo del mdico. Asegrese de hacerle al mdico cualquier pregunta que tenga. Document Released: 01/06/2014 Document Revised: 07/03/2017 Document Reviewed: 02/11/2017 Elsevier Patient Education  2020 Ar[employer]vier Inc.

## 2018-12-27 ENCOUNTER — Ambulatory Visit: Payer: Self-pay

## 2018-12-27 LAB — CHLAMYDIA/GC NAA, CONFIRMATION
Chlamydia trachomatis, NAA: NEGATIVE
Neisseria gonorrhoeae, NAA: NEGATIVE

## 2018-12-27 NOTE — Lactation Note (Signed)
This note was copied from a baby's chart. Lactation Consultation Note  Patient Name: Tamara Hahn [id number] Date: 12/27/2018   Mom and baby to be discharged.  Tamara Hahn has tight frenulum near tip of tongue.  Tamara Hahn still latching without assistance and mom is denying any nipple or breast pain.  She continues to give Tamara Hahn bottles of formula either after a breast feed or in place of a breast feed because she is convinced she needs more milk and she does not have what Tamara Hahn can get with bottles of formula.  She bottle fed formula to other 3 babies in the beginning and never produced enough milk.  Dani Gobble, CNM wrote order for mom and baby to be followed up with out patient lactation consult d/t tongue tie.  Will call mom tomorrow am to set up out patient consult once she schedules follow up appointment for baby with Dr. Ardis Hughs.  Spanish interpreter will needed for phone call and out patient consult.  Maternal Data    Feeding    LATCH Score                   Interventions    Lactation Tools Discussed/Used     Consult Status      Tamara Hahn 12/27/2018, 12:35 AM

## 2018-12-29 ENCOUNTER — Ambulatory Visit: Payer: Self-pay

## 2019-02-15 ENCOUNTER — Encounter: Payer: Self-pay | Admitting: Advanced Practice Midwife

## 2019-02-15 ENCOUNTER — Ambulatory Visit: Payer: Self-pay | Admitting: Advanced Practice Midwife

## 2019-02-15 ENCOUNTER — Other Ambulatory Visit: Payer: Self-pay

## 2019-02-15 LAB — HEMOGLOBIN, FINGERSTICK: Hemoglobin: 11.7 g/dL (ref 11.1–15.9)

## 2019-02-15 NOTE — Progress Notes (Addendum)
Here today for a PP visit. Patient delivered vaginally 12/25/2018 @ [facility]. Last Pap Smear was 04/21/2017 (NIL) last PE was 06/07/2018. Patient states she was given a Depo injection at discharge from the hospital 12/26/2018. Hgb check today. Hal Morales, RN Hgb 11.7. Taking PNV QD. Depo reminder card given. Hal Morales, RN

## 2019-02-15 NOTE — Progress Notes (Signed)
Post Partum Exam  Tamara Hahn is a 28 y.o.nonsmoker HF [phone] female who presents for a postpartum visit. SVD on 12/25/18 F 5#0 at [facility] instead of [facility] hospital.  Baby weighed 7# at 83 wks old.  Sex pp x1.  She is 7 weeks postpartum following a spontaneous vaginal delivery. I have fully reviewed the prenatal and intrapartum course. The delivery was at 74 1/7 gestational weeks.  Anesthesia: none. Postpartum course has been wnl. Baby's course has been wnl. Baby is feeding by bottle - formula. Bleeding no bleeding. Bowel function is normal. Bladder function is normal. Patient is sexually active. Contraception method is Depo-Provera injections.   Postpartum depression screening: Edinburgh Postnatal Depression Scale - 02/15/19 1608      Edinburgh Postnatal Depression Scale:  In the Past 7 Days   I have been able to laugh and see the funny side of things.  0    I have looked forward with enjoyment to things.  0    I have blamed myself unnecessarily when things went wrong.  0    I have been anxious or worried for no good reason.  0    I have felt scared or panicky for no good reason.  0    Things have been getting on top of me.  0    I have been so unhappy that I have had difficulty sleeping.  0    I have felt sad or miserable.  0    I have been so unhappy that I have been crying.  0    The thought of harming myself has occurred to me.  0    Edinburgh Postnatal Depression Scale Total  0         Last pap smear done 04/21/17 and was Normal  Review of Systems Pertinent items are noted in HPI.    Objective:  BP 121/67   Ht 5\' 1"  (1.549 m)   Wt 165 lb 6.4 oz (75 kg)   LMP 03/26/2018 (Exact Date)   Breastfeeding Yes   BMI 31.25 kg/m   General:  alert   Breasts:  negative  Lungs: clear to auscultation bilaterally  Heart:  regular rate and rhythm, S1, S2 normal, no murmur, click, rub or gallop  Abdomen: soft, non-tender; bowel sounds normal; no masses,  no organomegaly   Vulva:   normal  Vagina: normal vagina  Cervix:  multiparous appearance  Corpus: normal size, contour, position, consistency, mobility, non-tender  Adnexa:  normal adnexa  Rectal Exam: normal sphincter        Assessment:    7.2 wk postpartum exam.   Plan:   1. Contraception: Depo-Provera injections  Given while in hospital on 12/26/18.  Last physical 06/07/18 2. Infant feeding:  patient is currently feeding with formula.  If breastmilk feeding patient was given letter for employer to provide appropriate pumping time to express breastmilk.  3. Mood: EPDS is low risk. Reviewed resources and that mood sx in first year after pregnancy are considered related to pregnancy and to reach out for help at [facility] if needed. Discussed [facility] as link to care and availability of LCSW for counseling  4. Chronic Medical Conditions:   list chronic medical problems and follow up/management plan.   Patient given handout about PCP care in the community Given MVI per family planning program  Follow up in: 3 month or as needed for DMPA

## 2019-04-05 ENCOUNTER — Other Ambulatory Visit: Payer: Self-pay

## 2019-04-05 ENCOUNTER — Ambulatory Visit (LOCAL_COMMUNITY_HEALTH_CENTER): Payer: Self-pay

## 2019-04-05 VITALS — BP 105/67 | Ht 61.0 in | Wt 171.5 lb

## 2019-04-05 DIAGNOSIS — Z3009 Encounter for other general counseling and advice on contraception: Secondary | ICD-10-CM

## 2019-04-05 DIAGNOSIS — Z30013 Encounter for initial prescription of injectable contraceptive: Secondary | ICD-10-CM

## 2019-04-05 MED ORDER — MEDROXYPROGESTERONE ACETATE 150 MG/ML IM SUSP
150.0000 mg | Freq: Once | INTRAMUSCULAR | Status: AC
  Administered 2019-04-05: 150 mg via INTRAMUSCULAR

## 2019-04-05 NOTE — Progress Notes (Signed)
Pt had PP exam with Donnal Moat, CNM on 02/15/2019. Last depo was 12/26/2018 at the hospital post delivery; 14.2 weeks today. Consulted with provider regarding pt request to continue with depo today. Per verbal order by Charlotte Sanes, PA, administered DMPA 150 mg IM today, and received verbal order for pt to get DMPA 150 mg IM q 11-13 weeks until physical is due 02/15/2020.

## 2019-06-28 ENCOUNTER — Other Ambulatory Visit: Payer: Self-pay

## 2019-06-28 ENCOUNTER — Ambulatory Visit (LOCAL_COMMUNITY_HEALTH_CENTER): Payer: Self-pay

## 2019-06-28 VITALS — BP 114/66 | Ht 61.0 in | Wt 175.0 lb

## 2019-06-28 DIAGNOSIS — Z3009 Encounter for other general counseling and advice on contraception: Secondary | ICD-10-CM

## 2019-06-28 MED ORDER — MULTI-VITAMIN/MINERALS PO TABS: 1.0000 | ORAL_TABLET | Freq: Every day | ORAL | 0 refills | Status: AC

## 2019-06-28 MED ORDER — MEDROXYPROGESTERONE ACETATE 150 MG/ML IM SUSP
150.0000 mg | Freq: Once | INTRAMUSCULAR | Status: AC
  Administered 2019-06-28: 150 mg via INTRAMUSCULAR

## 2019-06-28 NOTE — Progress Notes (Signed)
Depo administered without difficulty per 04/05/2019 written order of Samara Snide PA-C and client tolerated without complaint. Folic acid counseling completed and MVI dispensed. Jossie Ng, RN

## 2019-07-11 ENCOUNTER — Ambulatory Visit: Payer: Self-pay | Attending: Internal Medicine

## 2019-07-11 DIAGNOSIS — Z23 Encounter for immunization: Secondary | ICD-10-CM

## 2019-07-11 NOTE — Progress Notes (Signed)
   Covid-19 Vaccination Clinic  Name:  Tamara Hahn    MRN: [id number] DOB: [birth year]  07/11/2019  [name] was observed post Covid-19 immunization for 15 minutes without incident. She was provided with Vaccine Information Sheet and instruction to access the V-Safe system.   [name] was instructed to call 911 with any severe reactions post vaccine: Marland Kitchen Difficulty breathing  . Swelling of face and throat  . A fast heartbeat  . A bad rash all over body  . Dizziness and weakness   Immunizations Administered    Name Date Dose VIS Date Route   Pfizer COVID-19 Vaccine 07/11/2019  5:23 PM 0.3 mL 03/18/2019 Intramuscular   Manufacturer: [employer], [employer]   Lot: [phone] [id number]   NDC: [address]-9

## 2019-08-01 ENCOUNTER — Ambulatory Visit: Payer: Self-pay | Attending: Internal Medicine

## 2019-08-01 DIAGNOSIS — Z23 Encounter for immunization: Secondary | ICD-10-CM

## 2019-08-01 NOTE — Progress Notes (Signed)
   Covid-19 Vaccination Clinic  Name:  Tamara Hahn    MRN: [id number] DOB: 19-Jun-1990  08/01/2019  Tamara Hahn was observed post Covid-19 immunization for 15 minutes without incident. She was provided with Vaccine Information Sheet and instruction to access the V-Safe system. Medical Interpreter used.  Tamara Hahn was instructed to call 911 with any severe reactions post vaccine: Marland Kitchen Difficulty breathing  . Swelling of face and throat  . A fast heartbeat  . A bad rash all over body  . Dizziness and weakness   Immunizations Administered    Name Date Dose VIS Date Route   Pfizer COVID-19 Vaccine 08/01/2019  4:58 PM 0.3 mL 06/01/2018 Intramuscular   Manufacturer: [employer], [employer]   Lot: [id number]   NDC: [address]-2

## 2019-09-14 ENCOUNTER — Ambulatory Visit (LOCAL_COMMUNITY_HEALTH_CENTER): Payer: Self-pay

## 2019-09-14 ENCOUNTER — Other Ambulatory Visit: Payer: Self-pay

## 2019-09-14 VITALS — BP 105/65 | Ht 61.0 in | Wt 174.0 lb

## 2019-09-14 DIAGNOSIS — Z30013 Encounter for initial prescription of injectable contraceptive: Secondary | ICD-10-CM

## 2019-09-14 DIAGNOSIS — Z3009 Encounter for other general counseling and advice on contraception: Secondary | ICD-10-CM

## 2019-09-14 MED ORDER — MEDROXYPROGESTERONE ACETATE 150 MG/ML IM SUSP
150.0000 mg | Freq: Once | INTRAMUSCULAR | Status: AC
  Administered 2019-09-14: 150 mg via INTRAMUSCULAR

## 2019-09-14 NOTE — Progress Notes (Signed)
Pt. Is 11 weeks 1 day post depo. Depo given today IM from order by Samara Snide, PA-C dated 04/05/19. Tolerated well. To return 11/30/19 for next depo. RP due 02/15/20. Jerel Shepherd, RN

## 2019-09-15 NOTE — Progress Notes (Signed)
I was consulted and agree with RN documentation 

## 2019-11-10 ENCOUNTER — Ambulatory Visit: Payer: Self-pay

## 2019-12-01 ENCOUNTER — Other Ambulatory Visit: Payer: Self-pay

## 2019-12-01 ENCOUNTER — Ambulatory Visit: Payer: Self-pay

## 2019-12-01 ENCOUNTER — Ambulatory Visit (LOCAL_COMMUNITY_HEALTH_CENTER): Payer: Self-pay

## 2019-12-01 VITALS — BP 117/68 | Ht 61.0 in | Wt 172.0 lb

## 2019-12-01 DIAGNOSIS — Z30013 Encounter for initial prescription of injectable contraceptive: Secondary | ICD-10-CM

## 2019-12-01 DIAGNOSIS — Z3009 Encounter for other general counseling and advice on contraception: Secondary | ICD-10-CM

## 2019-12-01 MED ORDER — MEDROXYPROGESTERONE ACETATE 150 MG/ML IM SUSP
150.0000 mg | Freq: Once | INTRAMUSCULAR | Status: AC
  Administered 2019-12-01: 150 mg via INTRAMUSCULAR

## 2019-12-01 NOTE — Progress Notes (Signed)
Depo given in Left Deltoid (pt declined R Delt) per [place], PA order on 04/05/19. RP due 02/15/20 Richmond Campbell, RN

## 2019-12-09 IMAGING — US US MFM OB DETAIL +14 WK
1 series · 12 of 28 positions shown · non-contrast
Comparison: none

PATIENT INFO:

PERFORMED BY:
                   Sonographer
                   LECHU CNM
SERVICE(S) PROVIDED:
 ----------------------------------------------------------------------
INDICATIONS:
  22 weeks gestation of pregnancy
FETAL EVALUATION:
 Num Of Fetuses:         1
 Fetal Heart Rate(bpm):  155
 Cardiac Activity:       Present
 Presentation:           Breech
 Placenta:               Fundal, No previa
 P. Cord Insertion:      Normal
 Amniotic Fluid
 AFI FV:      Normal
BIOMETRY:
 BPD:      50.5  mm     G. Age:  21w 2d          4  %    CI:        67.41   %    70 - 86
                                                         FL/HC:      19.1   %    19.2 -
 HC:      196.9  mm     G. Age:  21w 6d          8  %    HC/AC:      1.09        1.05 -
 AC:       180   mm     G. Age:  22w 6d         42  %    FL/BPD:     74.5   %    71 - 87
 FL:       37.6  mm     G. Age:  22w 0d         16  %    FL/AC:      20.9   %    20 - 24
 HUM:      37.2  mm     G. Age:  23w 0d         47  %
 Est. FW:     498  gm      1 lb 2 oz     26  %
GESTATIONAL AGE:
 LMP:           22w 6d        Date:  03/26/18                 EDD:   12/31/18
 U/S Today:     22w 0d                                        EDD:   01/06/19
 Best:          22w 6d     Det. By:  LMP  (03/26/18)          EDD:   12/31/18
ANATOMY:
 Cranium:               Within Normal Limits   Aortic Arch:            Not visualized
 Cavum:                 CSP visualized         Ductal Arch:            Normal appearance
 Ventricles:            Normal appearance      Diaphragm:              Within Normal Limits
 Choroid Plexus:        Within Normal Limits   Stomach:                Seen
 Cerebellum:            Within Normal Limits   Abdomen:                Within Normal
                                                                       Limits
 Posterior Fossa:       Within Normal Limits   Abdominal Wall:         Normal appearance
 Nuchal Fold:           Within Normal Limits   Cord Vessels:           3 vessels
 Face:                  Orbits visualized      Kidneys:                Normal appearance
 Lips:                  Normal appearance      Bladder:                Seen
 Thoracic:              Within Normal Limits   Spine:                  Normal appearance
 Heart:                 4-Chamber view         Upper Extremities:      Visualized
                        appears normal
 RVOT:                  Normal appearance      Lower Extremities:      Visualized
 LVOT:                  Normal appearance
CERVIX UTERUS ADNEXA:
 Cervix
 Length:           4.43  cm.

[Series 1: us mfm ob detail +14 wk · 0.25mm/px · 12 of 75 slices shown]
[im 3/75]
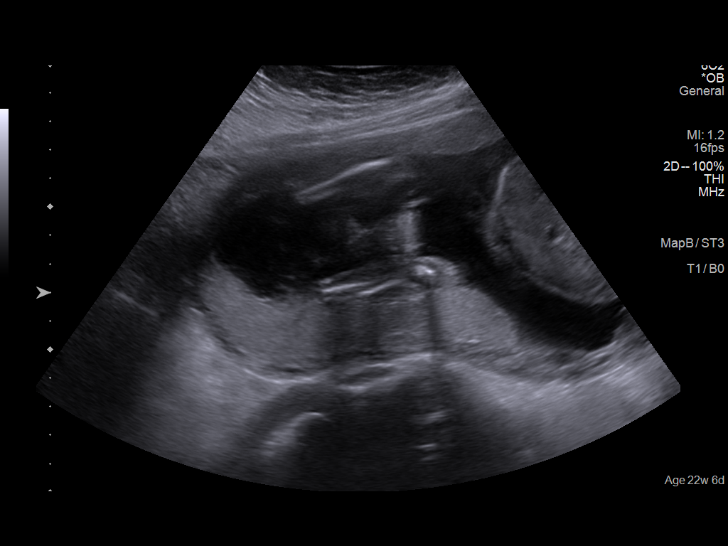
[im 9/75]
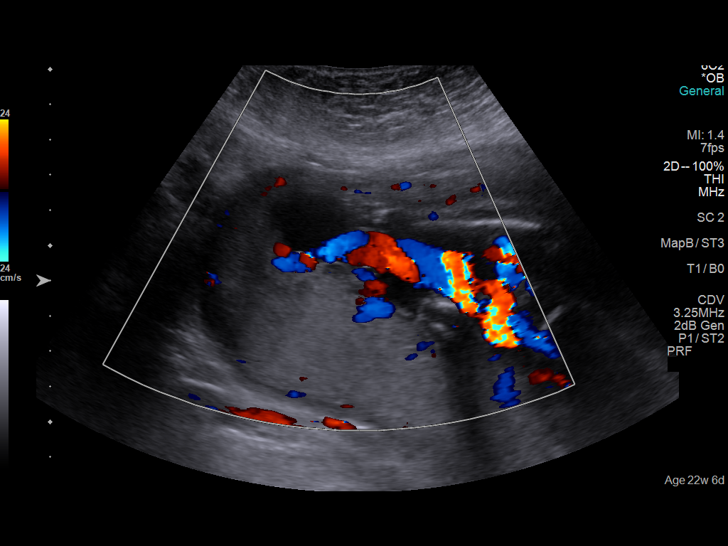
[im 14/75]
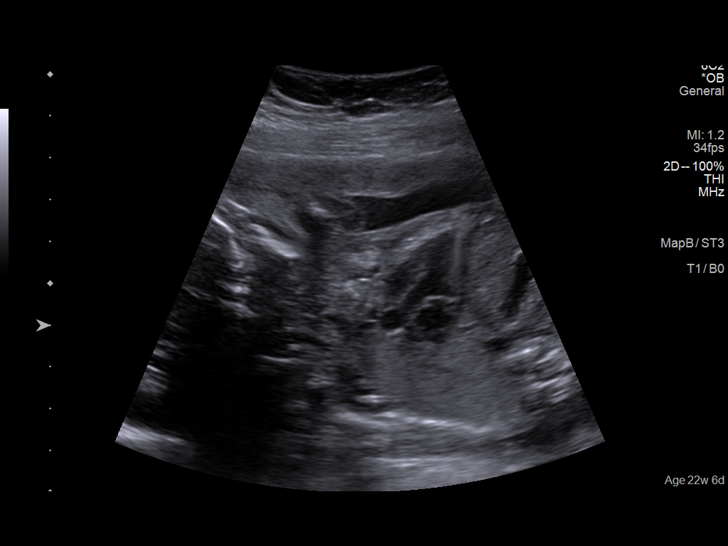
[im 22/75]
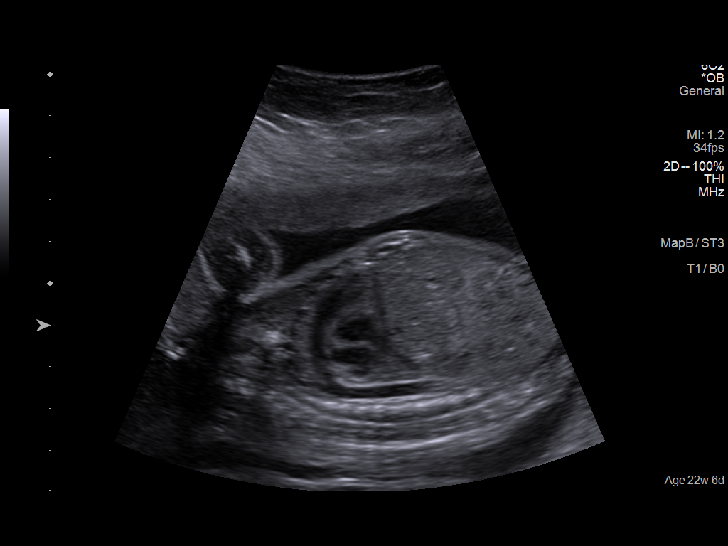
[im 28/75]
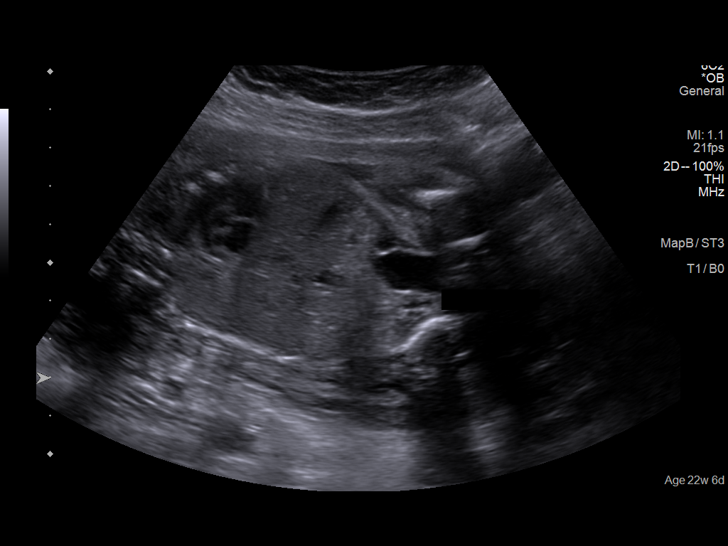
[im 33/75]
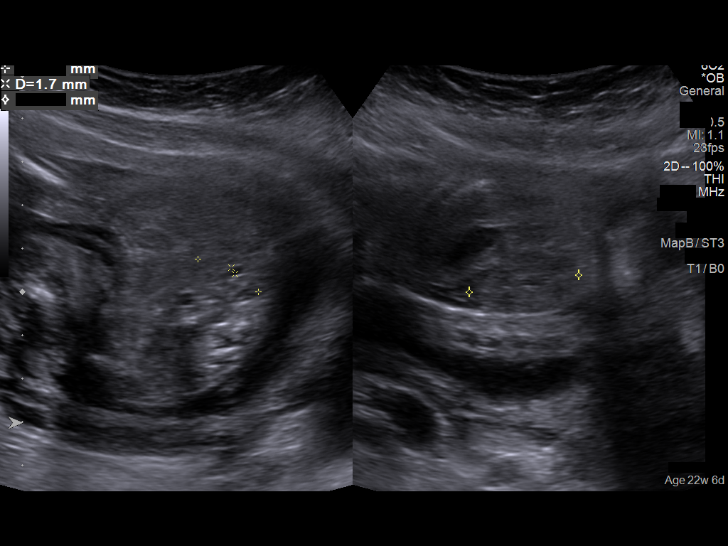
[im 42/75]
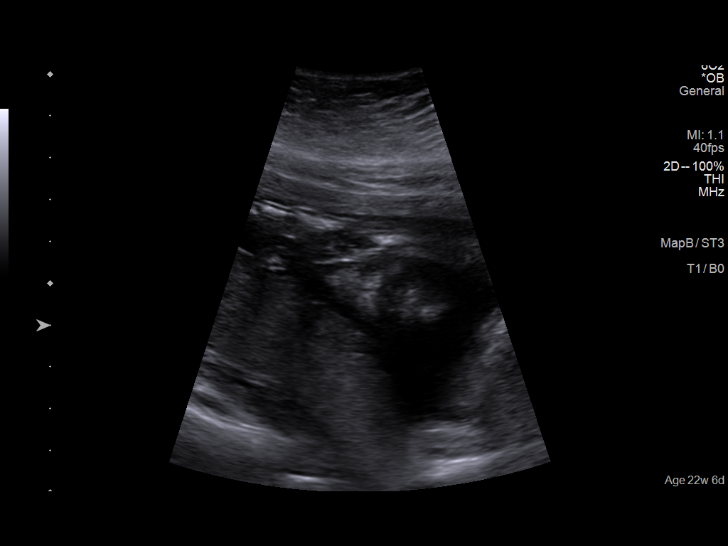
[im 47/75]
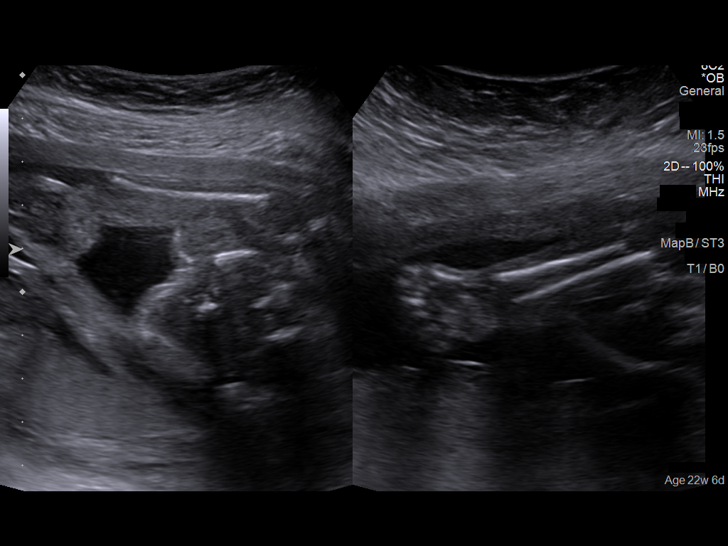
[im 53/75]
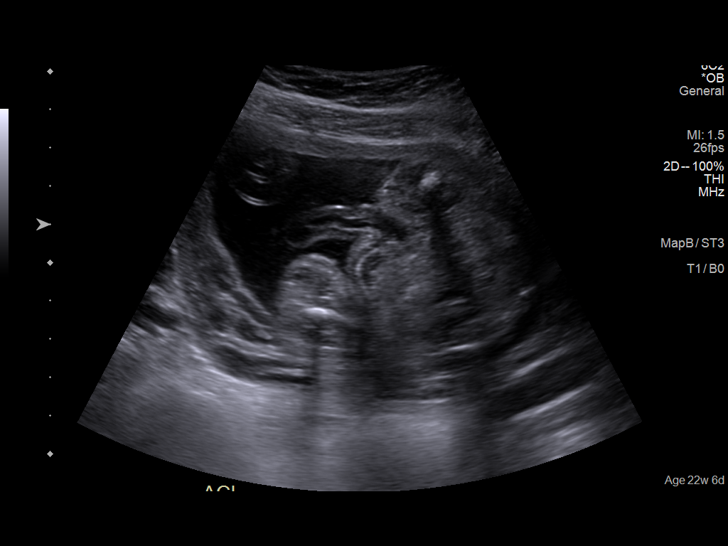
[im 61/75]
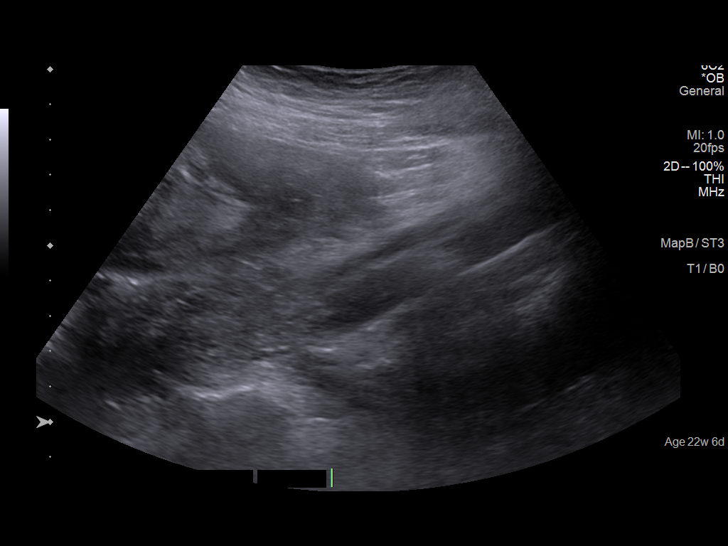
[im 66/75]
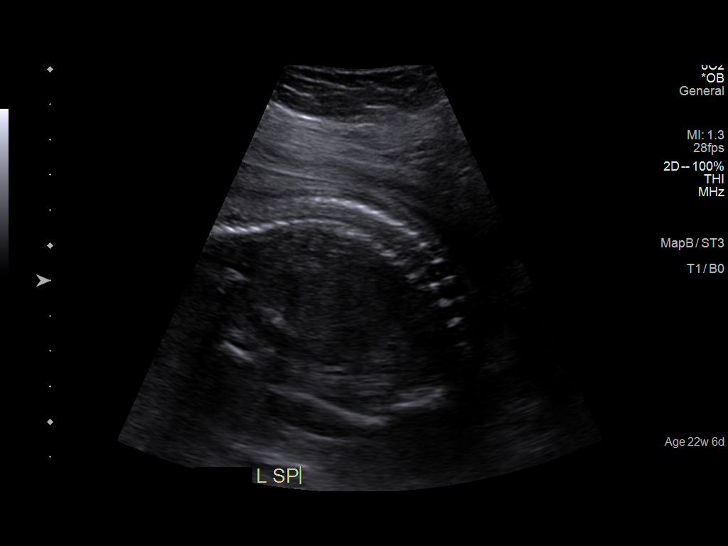
[im 72/75]
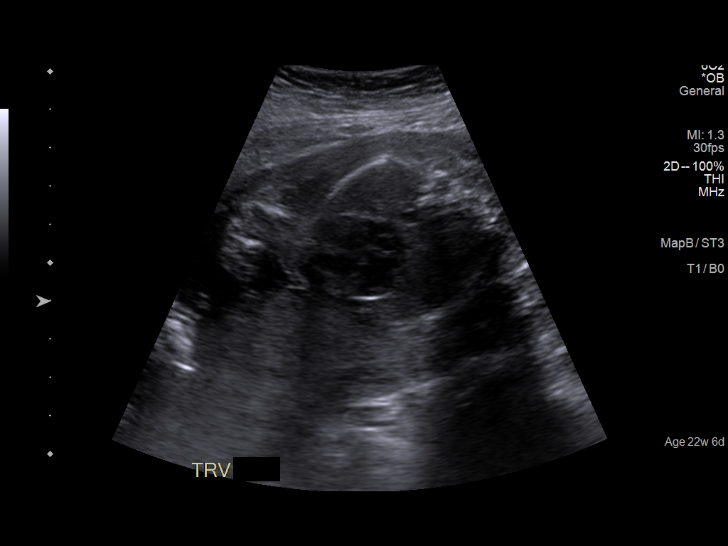

[12 of 28 positions shown; findings below may reference images not displayed]

IMPRESSION: Dear Dr.   LECHU,

 Thank you for referring your patient for fetal anatomic survey.

 There is a singleton gestation at 22 weeks 6 days. Dating is
 by LMP consistent with earliest available ultrasound
 performed at [facility] [HOSPITAL] on 06/24/18;
 measurements were consisent with 12 weeks 6 days.

 The fetal biometry correlates with established dating.

 The fetal anatomical survey appears within normal limits
 within the resolution of ultrasound as described above.  The
 amniotic fluid volume is normal and active fetal movements
 are seen.

 It must be noted that a normal ultrasound cannot rule out
 aneuploidy.

 Thank you for allowing us to participate in your patient's care.

 assistance.

                  Jumper, Giorgi

## 2020-02-20 ENCOUNTER — Other Ambulatory Visit: Payer: Self-pay

## 2020-02-20 ENCOUNTER — Ambulatory Visit (LOCAL_COMMUNITY_HEALTH_CENTER): Payer: Self-pay

## 2020-02-20 VITALS — BP 103/66 | Ht 61.0 in | Wt 172.5 lb

## 2020-02-20 DIAGNOSIS — Z3009 Encounter for other general counseling and advice on contraception: Secondary | ICD-10-CM

## 2020-02-20 DIAGNOSIS — Z3042 Encounter for surveillance of injectable contraceptive: Secondary | ICD-10-CM

## 2020-02-20 MED ORDER — MEDROXYPROGESTERONE ACETATE 150 MG/ML IM SUSP
150.0000 mg | Freq: Once | INTRAMUSCULAR | Status: AC
  Administered 2020-02-20: 150 mg via INTRAMUSCULAR

## 2020-02-20 NOTE — Progress Notes (Signed)
Pt is 11.4 weeks post depo today. DMPA 150 mg IM administered per Dr. Lyndel Safe standing orders to continue depo for one time when physical is due. (Also reference Samara Snide, PA order for depo order until physical due 02/15/20.)

## 2020-05-07 ENCOUNTER — Encounter: Payer: Self-pay | Admitting: Physician Assistant

## 2020-05-07 ENCOUNTER — Ambulatory Visit (LOCAL_COMMUNITY_HEALTH_CENTER): Payer: Self-pay | Admitting: Physician Assistant

## 2020-05-07 ENCOUNTER — Other Ambulatory Visit: Payer: Self-pay

## 2020-05-07 VITALS — BP 113/59 | Ht 61.0 in | Wt 175.0 lb

## 2020-05-07 DIAGNOSIS — Z3009 Encounter for other general counseling and advice on contraception: Secondary | ICD-10-CM

## 2020-05-07 DIAGNOSIS — Z3042 Encounter for surveillance of injectable contraceptive: Secondary | ICD-10-CM

## 2020-05-07 DIAGNOSIS — Z01419 Encounter for gynecological examination (general) (routine) without abnormal findings: Secondary | ICD-10-CM

## 2020-05-07 MED ORDER — MEDROXYPROGESTERONE ACETATE 150 MG/ML IM SUSP
150.0000 mg | INTRAMUSCULAR | Status: AC
  Administered 2020-05-07 – 2021-02-01 (×4): 150 mg via INTRAMUSCULAR

## 2020-05-07 NOTE — Progress Notes (Signed)
PAP results pending. Condoms declined. Provider orders completed.

## 2020-05-07 NOTE — Progress Notes (Signed)
Family Planning Visit- Repeat Yearly Visit  Subjective:  Tamara Hahn is a 30 y.o. [phone]  being seen today for an well woman visit and to discuss family planning options.    She is currently using Depo Provera for pregnancy prevention. Patient reports she does not want a pregnancy in the next year. Patient  has Family history of congenital anomalies; Encounter for supervision of normal pregnancy in multigravida; Thyroid dysfunction in pregnancy, antepartum, first trimester; Type O blood, Rh positive; Low birth weight infant; and Normal labor on their problem list.  Chief Complaint  Patient presents with  . Contraception    PE and  desires to continue with depo    Patient reports that she is doing well with the Depo and desires to continue with this as her BCM.  Per chart review, CBE due 2023 and pap is due today.   Patient denies any concerns today.    See flowsheet for other program required questions.   Body mass index is 33.07 kg/m. - Patient is eligible for diabetes screening based on BMI and age >71?  not applicable HA1C ordered? not applicable  Patient reports 1  partner in last year. Desires STI screening?  No - patient declines   Has patient been screened once for HCV in the past?  No  No results found for: HCVAB  Does the patient have current of drug use, have a partner with drug use, and/or has been incarcerated since last result? No  If yes-- Screen for HCV through [facility]   Does the patient meet criteria for HBV testing? No  Criteria:  -Household, sexual or needle sharing contact with HBV -History of drug use -HIV positive -Those with known Hep C   Health Maintenance Due  Topic Date Due  . Hepatitis C Screening  Never done  . COVID-19 Vaccine (3 - Pfizer risk 4-dose series) 08/29/2019  . INFLUENZA VACCINE  Never done  . PAP SMEAR-Modifier  04/21/2020    Review of Systems  All other systems reviewed and are negative.   The following  portions of the patient's history were reviewed and updated as appropriate: allergies, current medications, past family history, past medical history, past social history, past surgical history and problem list. Problem list updated.  Objective:   Vitals:   05/07/20 0906  BP: (!) 113/59  Weight: 175 lb (79.4 kg)  Height: 5\' 1"  (1.549 m)    Physical Exam Vitals and nursing note reviewed.  Constitutional:      General: She is not in acute distress.    Appearance: Normal appearance.  HENT:     Head: Normocephalic and atraumatic.     Comments: No nits,lice, or hair loss. No cervical, supraclavicular or axillary adenopathy.    Mouth/Throat:     Mouth: Mucous membranes are moist.     Pharynx: Oropharynx is clear. No oropharyngeal exudate or posterior oropharyngeal erythema.  Eyes:     Conjunctiva/sclera: Conjunctivae normal.  Neck:     Thyroid: No thyroid mass, thyromegaly or thyroid tenderness.  Cardiovascular:     Rate and Rhythm: Normal rate and regular rhythm.  Pulmonary:     Effort: Pulmonary effort is normal.     Breath sounds: Normal breath sounds.  Abdominal:     Palpations: Abdomen is soft. There is no mass.     Tenderness: There is no abdominal tenderness. There is no guarding or rebound.  Genitourinary:    General: Normal vulva.     Rectum: Normal.  Comments: External genitalia/pubic area without nits, lice, edema, erythema, lesions and inguinal adenopathy. Vagina with normal mucosa and discharge. Cervix without visible lesions. Uterus firm, mobile, nt, no masses, no CMT, no adnexal tenderness or fullness. Musculoskeletal:     Cervical back: Neck supple. No tenderness.  Lymphadenopathy:     Cervical: No cervical adenopathy.  Skin:    General: Skin is warm and dry.     Findings: No bruising, erythema, lesion or rash.  Neurological:     Mental Status: She is alert and oriented to person, place, and time.  Psychiatric:        Mood and Affect: Mood normal.         Behavior: Behavior normal.        Thought Content: Thought content normal.        Judgment: Judgment normal.       Assessment and Plan:  Tamara Hahn is a 30 y.o. female [phone] presenting to the [facility] Department for an yearly well woman exam/family planning visit  Contraception counseling: Reviewed all forms of birth control options in the tiered based approach. available including abstinence; over the counter/barrier methods; hormonal contraceptive medication including pill, patch, ring, injection,contraceptive implant, ECP; hormonal and nonhormonal IUDs; permanent sterilization options including vasectomy and the various tubal sterilization modalities. Risks, benefits, and typical effectiveness rates were reviewed.  Questions were answered.  Written information was also given to the patient to review.  Patient desires to continue with Depo , this was prescribed for patient. She will follow up in  3 months and prn for surveillance.  She was told to call with any further questions, or with any concerns about this method of contraception.  Emphasized use of condoms 100% of the time for STI prevention.  Patient was not a candidate for ECP today.    1. Encounter for counseling regarding contraception Reviewed with patient SE of Depo and when to call clinic for concerns. Enc condoms with all sex for STD protection.  2. Well woman exam with routine gynecological exam Reviewed with patient healthy habits to maintain general health. Enc weight bearing exercise and calcium intake for bone health. Enc MVI 1 po daily. Enc to establish with/ follow up with PCP for primary care concerns, age appropriate screenings and illness. Await pap results.  Counseled that RN will call or send letter re: results and when to follow up. - IGP, Aptima HPV  3. Surveillance for Depo-Provera contraception Continue with Depo 150 mg IM q 11-13 weeks for 1 year. - medroxyPROGESTERone  (DEPO-PROVERA) injection 150 mg     Return in about 11 weeks (around 07/23/2020) for Depo.  No future appointments.  Matt Holmes, PA

## 2020-05-10 LAB — IGP, APTIMA HPV
HPV Aptima: NEGATIVE
PAP Smear Comment: 0

## 2020-08-07 ENCOUNTER — Other Ambulatory Visit: Payer: Self-pay

## 2020-08-07 ENCOUNTER — Ambulatory Visit (LOCAL_COMMUNITY_HEALTH_CENTER): Payer: Self-pay

## 2020-08-07 VITALS — BP 113/68 | Ht 61.0 in | Wt 177.0 lb

## 2020-08-07 DIAGNOSIS — Z3009 Encounter for other general counseling and advice on contraception: Secondary | ICD-10-CM

## 2020-08-07 DIAGNOSIS — Z3042 Encounter for surveillance of injectable contraceptive: Secondary | ICD-10-CM

## 2020-08-07 NOTE — Progress Notes (Signed)
13 weeks 1 day post depo. Voices no concerns. Depo given today per order by C. [place], [place] dated 05/07/2020. Tolerated well Left deltoid (pt arm preference). Next depo due 10/23/2020, pt has reminder card. Jerel Shepherd, RN

## 2020-10-31 ENCOUNTER — Other Ambulatory Visit: Payer: Self-pay

## 2020-10-31 ENCOUNTER — Ambulatory Visit (LOCAL_COMMUNITY_HEALTH_CENTER): Payer: Self-pay

## 2020-10-31 VITALS — BP 96/67 | Ht 61.0 in | Wt 178.0 lb

## 2020-10-31 DIAGNOSIS — Z3009 Encounter for other general counseling and advice on contraception: Secondary | ICD-10-CM

## 2020-10-31 DIAGNOSIS — Z3042 Encounter for surveillance of injectable contraceptive: Secondary | ICD-10-CM

## 2020-10-31 NOTE — Progress Notes (Signed)
12 weeks 1 day post depo. Voices no concerns. Depo given today per order by C. [place], [place] dated 05/07/2020. Tolerated well R deltoid. Next depo due 01/16/2021, pt has reminder. M Yemen, interpreter. Jerel Shepherd, RN

## 2021-02-01 ENCOUNTER — Ambulatory Visit (LOCAL_COMMUNITY_HEALTH_CENTER): Payer: Self-pay

## 2021-02-01 ENCOUNTER — Other Ambulatory Visit: Payer: Self-pay

## 2021-02-01 VITALS — BP 120/67 | HR 78 | Ht 61.0 in | Wt 177.0 lb

## 2021-02-01 DIAGNOSIS — Z30013 Encounter for initial prescription of injectable contraceptive: Secondary | ICD-10-CM

## 2021-02-01 DIAGNOSIS — Z3009 Encounter for other general counseling and advice on contraception: Secondary | ICD-10-CM

## 2021-02-01 NOTE — Progress Notes (Signed)
Client tolerated Depo injection without complaint. Lemond Griffee, RN  

## 2021-04-24 ENCOUNTER — Other Ambulatory Visit: Payer: Self-pay

## 2021-04-24 ENCOUNTER — Ambulatory Visit (LOCAL_COMMUNITY_HEALTH_CENTER): Payer: Self-pay

## 2021-04-24 VITALS — BP 102/65 | HR 88 | Ht 61.0 in | Wt 178.5 lb

## 2021-04-24 DIAGNOSIS — Z3009 Encounter for other general counseling and advice on contraception: Secondary | ICD-10-CM

## 2021-04-24 DIAGNOSIS — Z30013 Encounter for initial prescription of injectable contraceptive: Secondary | ICD-10-CM

## 2021-04-24 MED ORDER — MEDROXYPROGESTERONE ACETATE 150 MG/ML IM SUSP
150.0000 mg | Freq: Once | INTRAMUSCULAR | Status: AC
  Administered 2021-04-24: 150 mg via INTRAMUSCULAR

## 2021-04-24 NOTE — Progress Notes (Signed)
As physical due end of 04/2021, Depo administered today per standing order of Dr. Kirtland Bouchard. [place]. Client  tolerated Depo injection without complaint. Jossie Ng, RN

## 2021-07-16 ENCOUNTER — Encounter: Payer: Self-pay | Admitting: Family Medicine

## 2021-07-16 ENCOUNTER — Ambulatory Visit (LOCAL_COMMUNITY_HEALTH_CENTER): Payer: Self-pay | Admitting: Family Medicine

## 2021-07-16 VITALS — BP 109/65 | Ht 61.0 in | Wt 180.8 lb

## 2021-07-16 DIAGNOSIS — Z309 Encounter for contraceptive management, unspecified: Secondary | ICD-10-CM

## 2021-07-16 DIAGNOSIS — Z113 Encounter for screening for infections with a predominantly sexual mode of transmission: Secondary | ICD-10-CM

## 2021-07-16 DIAGNOSIS — Z0000 Encounter for general adult medical examination without abnormal findings: Secondary | ICD-10-CM

## 2021-07-16 DIAGNOSIS — Z3042 Encounter for surveillance of injectable contraceptive: Secondary | ICD-10-CM

## 2021-07-16 LAB — HM HIV SCREENING LAB: HM HIV Screening: NEGATIVE

## 2021-07-16 LAB — WET PREP FOR TRICH, YEAST, CLUE
Trichomonas Exam: NEGATIVE
Yeast Exam: NEGATIVE

## 2021-07-16 MED ORDER — MEDROXYPROGESTERONE ACETATE 150 MG/ML IM SUSP
150.0000 mg | INTRAMUSCULAR | Status: AC
  Administered 2021-07-16 – 2021-10-10 (×2): 150 mg via INTRAMUSCULAR

## 2021-07-16 NOTE — Progress Notes (Signed)
[place] [facility] ?[place] ?Main Number: [phone] ? ?Family Planning Visit- Repeat Yearly Visit ? ?Subjective:  ?Tamara Hahn is a 31 y.o. [phone]  being seen today for an annual wellness visit and to discuss contraception options.   The patient is currently using Hormonal Injection for pregnancy prevention. Patient does not want a pregnancy in the next year.  ? ? report they are looking for a method that provides Minimal bleeding/improved bleeding profile, Discrete method, Method they can control starting/stopping, and Methods that does not involve too much memory ? ? ?Patient has the following medical problems: has Family history of congenital anomalies; Encounter for supervision of normal pregnancy in multigravida; Thyroid dysfunction in pregnancy, antepartum, first trimester; Type O blood, Rh positive; Low birth weight infant; and Normal labor on their problem list. ? ?Chief Complaint  ?Patient presents with  ? Annual Exam  ?  PE and Depo  ? ? ?Patient reports here for PE depo and STI testing  ? ?Patient denies any problems or concerns   ? ?See flowsheet for other program required questions.  ? ?Body mass index is 34.16 kg/m?. - Patient is eligible for diabetes screening based on BMI and age [id number]?  not applicable ?HA1C ordered? no ? ?Patient reports 1 of partners in last year. Desires STI screening?  Yes ? ? ?Has patient been screened once for HCV in the past?  No ? No results found for: HCVAB ? ?Does the patient have current of drug use, have a partner with drug use, and/or has been incarcerated since last result? No  ?If yes-- Screen for HCV through [facility] ?  ?Does the patient meet criteria for HBV testing? No ? ?Criteria:  ?-Household, sexual or needle sharing contact with HBV ?-History of drug use ?-HIV positive ?-Those with known Hep C ? ? ?Health Maintenance Due  ?Topic Date Due  ? Hepatitis C Screening  Never done  ? COVID-19  Vaccine (3 - Pfizer risk series) 08/29/2019  ? ? ?Review of Systems  ?Constitutional:  Negative for chills, fever, malaise/fatigue and weight loss.  ?HENT:  Negative for congestion, hearing loss and sore throat.   ?Eyes:  Negative for blurred vision, double vision and photophobia.  ?Respiratory:  Negative for shortness of breath.   ?Cardiovascular:  Negative for chest pain.  ?Gastrointestinal:  Negative for abdominal pain, blood in stool, constipation, diarrhea, heartburn, nausea and vomiting.  ?Genitourinary:  Negative for dysuria and frequency.  ?Musculoskeletal:  Negative for back pain, joint pain and neck pain.  ?Skin:  Negative for itching and rash.  ?Neurological:  Negative for dizziness, weakness and headaches.  ?Endo/Heme/Allergies:  Does not bruise/bleed easily.  ?Psychiatric/Behavioral:  Negative for depression, substance abuse and suicidal ideas.   ? ?The following portions of the patient's history were reviewed and updated as appropriate: allergies, current medications, past family history, past medical history, past social history, past surgical history and problem list. Problem list updated. ? ?Objective:  ? ?Vitals:  ? 07/16/21 0847  ?BP: 109/65  ?Weight: 180 lb 12.8 oz (82 kg)  ?Height: 5\' 1"  (1.549 m)  ? ? ?Physical Exam ?Vitals and nursing note reviewed.  ?Constitutional:   ?   Appearance: Normal appearance.  ?HENT:  ?   Head: Normocephalic and atraumatic.  ?   Mouth/Throat:  ?   Mouth: Mucous membranes are moist.  ?   Dentition: Abnormal dentition. No dental caries.  ?   Pharynx: No oropharyngeal exudate  or posterior oropharyngeal erythema.  ?   Comments: Tooth missing  ?Eyes:  ?   General: No scleral icterus. ?Neck:  ?   Thyroid: No thyroid mass, thyromegaly or thyroid tenderness.  ?Cardiovascular:  ?   Rate and Rhythm: Normal rate.  ?   Pulses: Normal pulses.  ?Pulmonary:  ?   Effort: Pulmonary effort is normal.  ?Chest:  ?Breasts: ?   Tanner Score is 5.  ?   Breasts are symmetrical.  ?   Right:  Normal. No swelling, inverted nipple, mass, nipple discharge or tenderness.  ?   Left: Normal. No swelling, inverted nipple, mass, nipple discharge or tenderness.  ?Abdominal:  ?   General: Abdomen is flat. Bowel sounds are normal.  ?   Palpations: Abdomen is soft.  ?Musculoskeletal:     ?   General: Normal range of motion.  ?Lymphadenopathy:  ?   Upper Body:  ?   Right upper body: No axillary adenopathy.  ?   Left upper body: No axillary adenopathy.  ?Skin: ?   General: Skin is warm and dry.  ?Neurological:  ?   General: No focal deficit present.  ?   Mental Status: She is alert.  ? ? ? ? ?Assessment and Plan:  ?[name] is a 31 y.o. female [phone] presenting to the [facility] Department for an yearly wellness and contraception visit ? ? ?Contraception counseling: Reviewed options based on patient desire and reproductive life plan. Patient is interested in Hormonal Injection. This was provided to the patient today.  ? ?Risks, benefits, and typical effectiveness rates were reviewed.  Questions were answered.  Written information was also given to the patient to review.   ? ?The patient will follow up in  11 weeks for surveillance.  The patient was told to call with any further questions, or with any concerns about this method of contraception.  Emphasized use of condoms 100% of the time for STI prevention. ? ?Patient was assessed for need for ECP. Patient was not offered ECP based on > 120 hours .  Patient is within 7 days of unprotected sex. Patient was offered ECP. Reviewed options and patient desired No method of ECP, declined all   ? ? ?1. Routine general medical examination at a health care facility ?Well woman  exam today  ?CBE today  ?Pap due 2027 ? ? ? ?2. Screening examination for venereal disease ?Patient accepted all screenings including wet prep,oral, vaginal CT/GC and bloodwork for HIV/RPR.  ? ?Wet prep results neg    ?Treatment needed   ?Discussed time line for [facility] results  and that patient will be called with positive results and encouraged patient to call if she had not heard in 2 weeks.  ?Counseled to return or seek care for continued or worsening symptoms ?Recommended condom use with all sex ? ? ?- Chlamydia/Gonorrhea [place] [facility] ?- WET PREP FOR [place], YEAST, CLUE ?- HIV [place] [facility] ?- Syphilis Serology, [facility] ? ?3. Surveillance for Depo-Provera contraception ?Satisfied with depo and wants to continue  ?Ok for DMPA 150 mg IM every 11-13 weeks x 1 year  ? ?- medroxyPROGESTERone (DEPO-PROVERA) injection 150 mg ? ? ?Return in about 11 weeks (around 10/01/2021) for depo. ? ?No future appointments. ? ?Junious Dresser, FNP ?

## 2021-07-16 NOTE — Progress Notes (Signed)
Pt here for PE and Depo.  Wet prep results reviewed, no treatment required per SO.  Depo 150 mg given IM in Lt Deltoid, without any complications.  Berdie Ogren, RN ? ?

## 2021-07-24 ENCOUNTER — Telehealth: Payer: Self-pay | Admitting: Family Medicine

## 2021-07-24 NOTE — Telephone Encounter (Signed)
Pt was here last week for her PE and was awaiting the lab results. She states someone from the clinic called her yesterday, but she needs an interpreter (Spanish) and she is not sure who or the reason for the call. I do not see any notes on the patient station. Please, verify if a provider was calling her. Thanks ?

## 2021-07-25 ENCOUNTER — Telehealth: Payer: Self-pay | Admitting: Family Medicine

## 2021-07-25 NOTE — Telephone Encounter (Signed)
Patient states that someone called her and wants to know what is the reason. ?

## 2021-10-10 ENCOUNTER — Ambulatory Visit (LOCAL_COMMUNITY_HEALTH_CENTER): Payer: Self-pay

## 2021-10-10 VITALS — BP 102/70 | Ht 61.0 in | Wt 182.0 lb

## 2021-10-10 DIAGNOSIS — Z3042 Encounter for surveillance of injectable contraceptive: Secondary | ICD-10-CM

## 2021-10-10 DIAGNOSIS — Z3009 Encounter for other general counseling and advice on contraception: Secondary | ICD-10-CM

## 2021-10-10 DIAGNOSIS — Z30013 Encounter for initial prescription of injectable contraceptive: Secondary | ICD-10-CM

## 2021-10-10 NOTE — Progress Notes (Signed)
12 weeks 2 days post depo.  Desires to change to nexplanon as BCM after today's visit.   Depo given IM right deltoid per order by Elveria Rising, FNP, dated 07/16/21.  Tolerated well.  Referred to clerk for appt for nexplanon insertion; appt 10/22/2021.    Interpreter: Marlene Yemen.   Cherlynn Polo, RN

## 2021-10-22 ENCOUNTER — Ambulatory Visit: Payer: Self-pay

## 2021-11-18 ENCOUNTER — Ambulatory Visit: Payer: Self-pay

## 2021-12-18 ENCOUNTER — Ambulatory Visit (LOCAL_COMMUNITY_HEALTH_CENTER): Payer: Self-pay | Admitting: Nurse Practitioner

## 2021-12-18 DIAGNOSIS — Z3009 Encounter for other general counseling and advice on contraception: Secondary | ICD-10-CM

## 2021-12-18 DIAGNOSIS — Z30017 Encounter for initial prescription of implantable subdermal contraceptive: Secondary | ICD-10-CM

## 2021-12-18 NOTE — Progress Notes (Signed)
Nexplanon Insertion Procedure Patient identified, informed consent performed, consent signed.   Patient does understand that irregular bleeding is a very common side effect of this medication. She was advised to have backup contraception after placement. Patient was determined to meet WHO criteria for not being pregnant. Appropriate time out taken.  The insertion site was identified 8-10 cm (3-4 inches) from the medial epicondyle of the humerus and 3-5 cm (1.25-2 inches) posterior to (below) the sulcus (groove) between the biceps and triceps muscles of the patient's left arm and marked.  Patient was prepped with alcohol swab and then injected with 3 ml of 1% lidocaine.  Arm was prepped with chlorhexidene, Nexplanon removed from packaging,  Device confirmed in needle, then inserted full length of needle and withdrawn per handbook instructions. Nexplanon was able to palpated in the patient's arm; patient palpated the insert herself. There was minimal blood loss.  Patient insertion site covered with guaze and a pressure bandage to reduce any bruising.  The patient tolerated the procedure well and was given post procedure instructions.  Caprina Wussow, FNP  

## 2022-10-14 ENCOUNTER — Emergency Department
Admission: EM | Admit: 2022-10-14 | Discharge: 2022-10-14 | Disposition: A | Discharge status: Home / Self Care | Payer: Self-pay | Attending: Emergency Medicine | Admitting: Emergency Medicine

## 2022-10-14 ENCOUNTER — Encounter: Payer: Self-pay | Admitting: Emergency Medicine

## 2022-10-14 ENCOUNTER — Other Ambulatory Visit: Payer: Self-pay

## 2022-10-14 ENCOUNTER — Emergency Department: Payer: Self-pay

## 2022-10-14 DIAGNOSIS — W182XXA Fall in (into) shower or empty bathtub, initial encounter: Secondary | ICD-10-CM | POA: Insufficient documentation

## 2022-10-14 DIAGNOSIS — S20211A Contusion of right front wall of thorax, initial encounter: Secondary | ICD-10-CM | POA: Insufficient documentation

## 2022-10-14 DIAGNOSIS — R109 Unspecified abdominal pain: Secondary | ICD-10-CM | POA: Insufficient documentation

## 2022-10-14 DIAGNOSIS — Y92002 Bathroom of unspecified non-institutional (private) residence single-family (private) house as the place of occurrence of the external cause: Secondary | ICD-10-CM | POA: Insufficient documentation

## 2022-10-14 LAB — CBC
HCT: 39 % (ref 36.0–46.0)
Hemoglobin: 12.9 g/dL (ref 12.0–15.0)
MCH: 25.9 pg — ABNORMAL LOW (ref 26.0–34.0)
MCHC: 33.1 g/dL (ref 30.0–36.0)
MCV: 78.2 fL — ABNORMAL LOW (ref 80.0–100.0)
Platelets: 317 10*3/uL (ref 150–400)
RBC: 4.99 MIL/uL (ref 3.87–5.11)
RDW: 12.9 % (ref 11.5–15.5)
WBC: 10.7 10*3/uL — ABNORMAL HIGH (ref 4.0–10.5)
nRBC: 0 % (ref 0.0–0.2)

## 2022-10-14 LAB — URINALYSIS, ROUTINE W REFLEX MICROSCOPIC
Bilirubin Urine: NEGATIVE
Glucose, UA: NEGATIVE mg/dL
Hgb urine dipstick: NEGATIVE
Ketones, ur: NEGATIVE mg/dL
Nitrite: NEGATIVE
Protein, ur: NEGATIVE mg/dL
Specific Gravity, Urine: 1.023 (ref 1.005–1.030)
pH: 5 (ref 5.0–8.0)

## 2022-10-14 LAB — BASIC METABOLIC PANEL
Anion gap: 7 (ref 5–15)
BUN: 10 mg/dL (ref 6–20)
CO2: 23 mmol/L (ref 22–32)
Calcium: 9 mg/dL (ref 8.9–10.3)
Chloride: 106 mmol/L (ref 98–111)
Creatinine, Ser: 0.47 mg/dL (ref 0.44–1.00)
GFR, Estimated: >60 mL/min (ref >60)
Glucose, Bld: 92 mg/dL (ref 70–99)
Potassium: 4.6 mmol/L (ref 3.5–5.1)
Sodium: 136 mmol/L (ref 135–145)

## 2022-10-14 LAB — POC URINE PREG, ED: Preg Test, Ur: NEGATIVE

## 2022-10-14 MED ORDER — KETOROLAC TROMETHAMINE 30 MG/ML IJ SOLN
30.0000 mg | Freq: Once | INTRAMUSCULAR | Status: AC
  Administered 2022-10-14: 30 mg via INTRAMUSCULAR
  Filled 2022-10-14: qty 1

## 2022-10-14 MED ORDER — LIDOCAINE 5 % EX PTCH: 1.0000 | MEDICATED_PATCH | Freq: Two times a day (BID) | CUTANEOUS | 0 refills | Status: AC

## 2022-10-14 MED ORDER — KETOROLAC TROMETHAMINE 10 MG PO TABS: 10.0000 mg | ORAL_TABLET | Freq: Four times a day (QID) | ORAL | 0 refills | Status: AC | PRN

## 2022-10-14 MED ORDER — LIDOCAINE 5 % EX PTCH
1.0000 | MEDICATED_PATCH | CUTANEOUS | Status: DC
  Administered 2022-10-14: 1 via TRANSDERMAL
  Filled 2022-10-14: qty 1

## 2022-10-14 NOTE — ED Triage Notes (Signed)
Pt comes with c/o right sided flank pain for 8 days. Pt states no pain with urination. Pt denies ay N/V/D

## 2022-10-14 NOTE — ED Provider Notes (Signed)
[facility] Provider Note    Event Date/Time   First MD Initiated Contact with Patient 10/14/22 1124     (approximate)   History   Chief Complaint Flank Pain   HPI  Sylvette Morace is a 32 y.o. female with no significant past medical history who presents to the ED complaining of flank pain.  Patient reports that about 1 week ago she slipped and fell in her bathroom, hitting the right side of her chest on the side of the bathtub.  She denies hitting her head or losing consciousness.  Since then, she has had ongoing pain to the right side of her chest that is worse when she moves her right arm or takes a deep breath.  She denies any shortness of breath and has not had any fevers or cough.  She denies any pain in her abdomen.  She has been taking ibuprofen without significant relief.     Physical Exam   Triage Vital Signs: ED Triage Vitals [10/14/22 1006]  Enc Vitals Group     BP (!) 123/53     Pulse Rate 72     Resp 18     Temp 98 F (36.7 C)     Temp src      SpO2 100 %     Weight      Height      Head Circumference      Peak Flow      Pain Score 8     Pain Loc      Pain Edu?      Excl. in GC?     Most recent vital signs: Vitals:   10/14/22 1006  BP: (!) 123/53  Pulse: 72  Resp: 18  Temp: 98 F (36.7 C)  SpO2: 100%    Constitutional: Alert and oriented. Eyes: Conjunctivae are normal. Head: Atraumatic. Nose: No congestion/rhinnorhea. Mouth/Throat: Mucous membranes are moist.  Cardiovascular: Normal rate, regular rhythm. Grossly normal heart sounds.  2+ radial pulses bilaterally. Respiratory: Normal respiratory effort.  No retractions. Lungs CTAB.  Right chest wall tenderness to palpation, no overlying ecchymosis or edema. Gastrointestinal: Soft and nontender. No distention. Musculoskeletal: No lower extremity tenderness nor edema.  Neurologic:  Normal speech and language. No gross focal neurologic deficits are  appreciated.    ED Results / Procedures / Treatments   Labs (all labs ordered are listed, but only abnormal results are displayed) Labs Reviewed  URINALYSIS, ROUTINE W REFLEX MICROSCOPIC - Abnormal; Notable for the following components:      Result Value   Color, Urine YELLOW (*)    APPearance CLOUDY (*)    Leukocytes,Ua LARGE (*)    Bacteria, UA FEW (*)    All other components within normal limits  CBC - Abnormal; Notable for the following components:   WBC 10.7 (*)    MCV 78.2 (*)    MCH 25.9 (*)    All other components within normal limits  BASIC METABOLIC PANEL  POC URINE PREG, ED   RADIOLOGY Chest x-ray reviewed and interpreted by me with no infiltrate, edema, or effusion.  PROCEDURES:  Critical Care performed: No  Procedures   MEDICATIONS ORDERED IN ED: Medications  lidocaine (LIDODERM) 5 % 1 patch (1 patch Transdermal Patch Applied 10/14/22 1221)  ketorolac (TORADOL) 30 MG/ML injection 30 mg (30 mg Intramuscular Given 10/14/22 1222)     IMPRESSION / MDM / ASSESSMENT AND PLAN / ED COURSE  I reviewed the triage vital signs and  the nursing notes.                              32 y.o. female with no significant past medical history presents to the ED complaining of right chest wall pain following slip and fall striking the right side of her chest in the bathroom 1 week ago.  Patient's presentation is most consistent with acute presentation with potential threat to life or bodily function.  Differential diagnosis includes, but is not limited to, rib fracture, hemothorax, pneumothorax, chest wall contusion.  Patient nontoxic-appearing and in no acute distress, vital signs are unremarkable.  Pain is reproducible with palpation of her right chest wall, no overlying ecchymosis or edema noted.  We will further assess with chest x-ray, treat symptomatically with Lidoderm patch and IM Toradol.  Pregnancy testing is negative, urinalysis borderline for infection but patient  denies any urinary symptoms and we will hold off on antibiotics.  Labs are reassuring with no significant anemia, leukocytosis, tract abnormality, or AKI.  Chest x-ray is unremarkable, patient reports feeling better following dose of Toradol and Lidoderm patch.  She is appropriate for discharge home with PCP follow-up, we prescribed Lidoderm patch and Toradol for home use.  She was counseled to return to the ED for new or worsening symptoms, patient agrees with plan.      FINAL CLINICAL IMPRESSION(S) / ED DIAGNOSES   Final diagnoses:  Contusion of right chest wall, initial encounter     Rx / DC Orders   ED Discharge Orders          Ordered    lidocaine (LIDODERM) 5 %  Every 12 hours        10/14/22 1348    ketorolac (TORADOL) 10 MG tablet  Every 6 hours PRN        10/14/22 1348             Note:  This document was prepared using Dragon voice recognition software and may include unintentional dictation errors.   Chesley Noon, MD 10/14/22 1349

## 2024-01-15 ENCOUNTER — Ambulatory Visit: Payer: Self-pay
